# Patient Record
Sex: Female | Born: 1990 | Race: White | Hispanic: No | Marital: Single | Smoking: Never smoker
Health system: Southern US, Community
[De-identification: ages and names within clinical notes are randomized; demographics above are authoritative.]

## PROBLEM LIST (undated history)

## (undated) DIAGNOSIS — E282 Polycystic ovarian syndrome: Principal | ICD-10-CM

## (undated) DIAGNOSIS — E221 Hyperprolactinemia: Secondary | ICD-10-CM

## (undated) DIAGNOSIS — F99 Mental disorder, not otherwise specified: Secondary | ICD-10-CM

## (undated) HISTORY — DX: Polycystic ovarian syndrome: E28.2

## (undated) HISTORY — DX: Hyperprolactinemia: E22.1

## (undated) HISTORY — DX: Mental disorder, not otherwise specified: F99

---
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
## 2013-11-30 ENCOUNTER — Emergency Department: Payer: Self-pay | Admitting: Emergency Medicine

## 2015-10-24 ENCOUNTER — Emergency Department: Payer: PRIVATE HEALTH INSURANCE

## 2015-10-24 ENCOUNTER — Emergency Department
Admission: EM | Admit: 2015-10-24 | Discharge: 2015-10-24 | Disposition: A | Discharge status: Home / Self Care | Payer: PRIVATE HEALTH INSURANCE | Attending: Emergency Medicine | Admitting: Emergency Medicine

## 2015-10-24 DIAGNOSIS — M62830 Muscle spasm of back: Secondary | ICD-10-CM | POA: Diagnosis not present

## 2015-10-24 DIAGNOSIS — Z880 Allergy status to penicillin: Secondary | ICD-10-CM | POA: Insufficient documentation

## 2015-10-24 DIAGNOSIS — Z3202 Encounter for pregnancy test, result negative: Secondary | ICD-10-CM | POA: Insufficient documentation

## 2015-10-24 DIAGNOSIS — N200 Calculus of kidney: Secondary | ICD-10-CM | POA: Diagnosis not present

## 2015-10-24 DIAGNOSIS — N939 Abnormal uterine and vaginal bleeding, unspecified: Secondary | ICD-10-CM

## 2015-10-24 DIAGNOSIS — R109 Unspecified abdominal pain: Secondary | ICD-10-CM | POA: Diagnosis present

## 2015-10-24 LAB — COMPREHENSIVE METABOLIC PANEL
ALBUMIN: 4 g/dL (ref 3.5–5.0)
ALT: 18 U/L (ref 14–54)
ANION GAP: 7 (ref 5–15)
AST: 15 U/L (ref 15–41)
Alkaline Phosphatase: 48 U/L (ref 38–126)
BILIRUBIN TOTAL: 0.6 mg/dL (ref 0.3–1.2)
BUN: 17 mg/dL (ref 6–20)
CO2: 21 mmol/L — ABNORMAL LOW (ref 22–32)
Calcium: 9.2 mg/dL (ref 8.9–10.3)
Chloride: 110 mmol/L (ref 101–111)
Creatinine, Ser: 0.9 mg/dL (ref 0.44–1.00)
GFR calc Af Amer: >60 mL/min (ref >60)
Glucose, Bld: 100 mg/dL — ABNORMAL HIGH (ref 65–99)
POTASSIUM: 4 mmol/L (ref 3.5–5.1)
Sodium: 138 mmol/L (ref 135–145)
TOTAL PROTEIN: 7.1 g/dL (ref 6.5–8.1)

## 2015-10-24 LAB — CBC
HEMATOCRIT: 38.9 % (ref 35.0–47.0)
Hemoglobin: 13.1 g/dL (ref 12.0–16.0)
MCH: 28.6 pg (ref 26.0–34.0)
MCHC: 33.7 g/dL (ref 32.0–36.0)
MCV: 84.8 fL (ref 80.0–100.0)
PLATELETS: 213 10*3/uL (ref 150–440)
RBC: 4.59 MIL/uL (ref 3.80–5.20)
RDW: 12.8 % (ref 11.5–14.5)
WBC: 5.7 10*3/uL (ref 3.6–11.0)

## 2015-10-24 LAB — URINALYSIS COMPLETE WITH MICROSCOPIC (ARMC ONLY)
BACTERIA UA: NONE SEEN
BILIRUBIN URINE: NEGATIVE
Bacteria, UA: NONE SEEN
Bilirubin Urine: NEGATIVE
GLUCOSE, UA: NEGATIVE mg/dL
GLUCOSE, UA: NEGATIVE mg/dL
KETONES UR: NEGATIVE mg/dL
Ketones, ur: NEGATIVE mg/dL
NITRITE: NEGATIVE
Nitrite: NEGATIVE
PH: 5 (ref 5.0–8.0)
Protein, ur: 30 mg/dL — AB
Protein, ur: NEGATIVE mg/dL
SPECIFIC GRAVITY, URINE: 1.02 (ref 1.005–1.030)
SPECIFIC GRAVITY, URINE: 1.008 (ref 1.005–1.030)
pH: 7 (ref 5.0–8.0)

## 2015-10-24 LAB — LIPASE, BLOOD: LIPASE: 25 U/L (ref 11–51)

## 2015-10-24 LAB — POCT PREGNANCY, URINE: PREG TEST UR: NEGATIVE

## 2015-10-24 MED ORDER — KETOROLAC TROMETHAMINE 30 MG/ML IJ SOLN
30.0000 mg | Freq: Once | INTRAMUSCULAR | Status: AC
  Administered 2015-10-24: 30 mg via INTRAVENOUS
  Filled 2015-10-24: qty 1

## 2015-10-24 MED ORDER — SODIUM CHLORIDE 0.9 % IV SOLN
1000.0000 mL | Freq: Once | INTRAVENOUS | Status: AC
  Administered 2015-10-24: 1000 mL via INTRAVENOUS

## 2015-10-24 MED ORDER — ONDANSETRON HCL 4 MG/2ML IJ SOLN
4.0000 mg | Freq: Once | INTRAMUSCULAR | Status: AC
  Administered 2015-10-24: 4 mg via INTRAVENOUS
  Filled 2015-10-24: qty 2

## 2015-10-24 MED ORDER — HYDROCODONE-ACETAMINOPHEN 5-325 MG PO TABS: 1.0000 | ORAL_TABLET | ORAL | Status: DC | PRN

## 2015-10-24 MED ORDER — NAPROXEN 500 MG PO TABS: 500.0000 mg | ORAL_TABLET | Freq: Two times a day (BID) | ORAL | Status: DC

## 2015-10-24 MED ORDER — MORPHINE SULFATE (PF) 2 MG/ML IV SOLN
2.0000 mg | Freq: Once | INTRAVENOUS | Status: AC
  Administered 2015-10-24: 2 mg via INTRAVENOUS
  Filled 2015-10-24: qty 1

## 2015-10-24 MED ORDER — ONDANSETRON HCL 4 MG PO TABS: 4.0000 mg | ORAL_TABLET | Freq: Every day | ORAL | Status: DC | PRN

## 2015-10-24 NOTE — ED Notes (Signed)
Back from ct   Iv pain meds given side rails up and effects explained

## 2015-10-24 NOTE — Discharge Instructions (Signed)
Flank Pain °Flank pain refers to pain that is located on the side of the body between the upper abdomen and the back. The pain may occur over a short period of time (acute) or may be long-term or reoccurring (chronic). It may be mild or severe. Flank pain can be caused by many things. °CAUSES  °Some of the more common causes of flank pain include: °· Muscle strains.   °· Muscle spasms.   °· A disease of your spine (vertebral disk disease).   °· A lung infection (pneumonia).   °· Fluid around your lungs (pulmonary edema).   °· A kidney infection.   °· Kidney stones.   °· A very painful skin rash caused by the chickenpox virus (shingles).   °· Gallbladder disease.   °HOME CARE INSTRUCTIONS  °Home care will depend on the cause of your pain. In general, °· Rest as directed by your caregiver. °· Drink enough fluids to keep your urine clear or pale yellow. °· Only take over-the-counter or prescription medicines as directed by your caregiver. Some medicines may help relieve the pain. °· Tell your caregiver about any changes in your pain. °· Follow up with your caregiver as directed. °SEEK IMMEDIATE MEDICAL CARE IF:  °· Your pain is not controlled with medicine.   °· You have new or worsening symptoms. °· Your pain increases.   °· You have abdominal pain.   °· You have shortness of breath.   °· You have persistent nausea or vomiting.   °· You have swelling in your abdomen.   °· You feel faint or pass out.   °· You have blood in your urine. °· You have a fever or persistent symptoms for more than 2-3 days. °· You have a fever and your symptoms suddenly get worse. °MAKE SURE YOU:  °· Understand these instructions. °· Will watch your condition. °· Will get help right away if you are not doing well or get worse. °  °This information is not intended to replace advice given to you by your health care provider. Make sure you discuss any questions you have with your health care provider. °  °Document Released: 12/27/2005 Document  Revised: 07/30/2012 Document Reviewed: 06/19/2012 °Elsevier Interactive Patient Education ©2016 Elsevier Inc. ° °

## 2015-10-24 NOTE — ED Notes (Signed)
Resting at present  Family at bedside   

## 2015-10-24 NOTE — ED Notes (Signed)
Additional pain meds given. Family at bedside  

## 2015-10-24 NOTE — ED Notes (Signed)
States pain has eased off some   Dr Cyril LoosenKinner in with pt

## 2015-10-24 NOTE — ED Notes (Signed)
Sudden onset of left flank pain.  Patent with nausea but no vomiting.  Denies any abnormal urinary symptoms.

## 2015-10-24 NOTE — ED Provider Notes (Signed)
[facility] Emergency Department Provider Note  ____________________________________________  Time seen: On arrival  I have reviewed the triage vital signs and the nursing notes.   HISTORY  Chief Complaint Flank Pain    HPI Maureen Mcbride is a 24 y.o. female who presents with complaints of moderate to severe left flank pain that is sharp in nature. Patient reports her left side was aching yesterday and during her shift at work last night but she woke up at 6 AM and had severe left flank pain with nausea that radiated to her groin. No fevers no chills. No dysuria. Does have history of UTIs but this does not feel similar. She has a history of an appendectomy, chronic back pain, no other medical problems. No fevers no chills.     History reviewed. No pertinent past medical history.  There are no active problems to display for this patient.   History reviewed. No pertinent past surgical history.  No current outpatient prescriptions on file.  Allergies Penicillins  History reviewed. No pertinent family history.  Social History Social History  Substance Use Topics  . Smoking status: Never Smoker   . Smokeless tobacco: None  . Alcohol Use: No    Review of Systems  Constitutional: Negative for fever. Eyes: Negative for visual changes. ENT: Negative for sore throat Cardiovascular: Negative for chest pain. Respiratory: Negative for shortness of breath. Gastrointestinal: Positive for nausea Genitourinary: Negative for dysuria. Musculoskeletal: Positive for back pain Skin: Negative for rash. Neurological: Negative for headaches or focal weakness Psychiatric: No anxiety    ____________________________________________   PHYSICAL EXAM:  VITAL SIGNS: ED Triage Vitals  Enc Vitals Group     BP 10/24/15 0711 128/85 mmHg     Pulse Rate 10/24/15 0711 74     Resp 10/24/15 0711 18     Temp 10/24/15 0711 97.7 F (36.5 C)     Temp Source 10/24/15  0711 Oral     SpO2 10/24/15 0711 100 %     Weight 10/24/15 0711 160 lb (72.576 kg)     Height 10/24/15 0711 4\' 10"  (1.473 m)     Head Cir --      Peak Flow --      Pain Score 10/24/15 0712 8     Pain Loc --      Pain Edu? --      Excl. in GC? --      Constitutional: Alert and oriented. Well appearing but uncomfortable. Pleasant and interactive Eyes: Conjunctivae are normal.  ENT   Head: Normocephalic and atraumatic.   Mouth/Throat: Mucous membranes are moist. Cardiovascular: Normal rate, regular rhythm. Normal and symmetric distal pulses are present in all extremities. No murmurs, rubs, or gallops. Respiratory: Normal respiratory effort without tachypnea nor retractions.  Gastrointestinal: Soft and non-tender in all quadrants. No distention. There is no CVA tenderness. Genitourinary: deferred Musculoskeletal: Nontender with normal range of motion in all extremities. No lower extremity tenderness nor edema. Patient does have left lumbar paraspinal muscle spasm with mild discomfort but this does not feel similar to the pain she is having. Neurologic:  Normal speech and language. No gross focal neurologic deficits are appreciated. Skin:  Skin is warm, dry and intact. No rash noted. Psychiatric: Mood and affect are normal. Patient exhibits appropriate insight and judgment.  ____________________________________________    LABS (pertinent positives/negatives)  Labs Reviewed  URINALYSIS COMPLETEWITH MICROSCOPIC ([facility] ONLY)  CBC  COMPREHENSIVE METABOLIC PANEL  LIPASE, BLOOD  POC URINE PREG, ED  POCT  PREGNANCY, URINE    ____________________________________________   EKG  None  ____________________________________________    RADIOLOGY I have personally reviewed any xrays that were ordered on this patient: CT renal stone study shows no stone. Ultrasound pelvis shows none abnormality  ____________________________________________   PROCEDURES  Procedure(s)  performed: none  Critical Care performed: none  ____________________________________________   INITIAL IMPRESSION / ASSESSMENT AND PLAN / ED COURSE  Pertinent labs & imaging results that were available during my care of the patient were reviewed by me and considered in my medical decision making (see chart for details).  Patient's presentation is suspicious for kidney stone given abrupt onset left flank pain rated to groin. We will check urine, labs, CT renal stone study. We will give Toradol IV, fluids and Zofran IV.  Ultrasound negative. Given that CT was negative patient is to have blood in her urine I'm suspicious of a passed kidney stone. I discussed return precautions with the patient extensively. No evidence of urinary tract infection.  ____________________________________________   FINAL CLINICAL IMPRESSION(S) / ED DIAGNOSES  Final diagnoses:  Flank pain, acute     Jene Every, MD 10/24/15 1454

## 2015-12-13 ENCOUNTER — Encounter: Payer: Self-pay | Admitting: *Deleted

## 2015-12-13 ENCOUNTER — Emergency Department
Admission: EM | Admit: 2015-12-13 | Discharge: 2015-12-13 | Disposition: A | Discharge status: Home / Self Care | Payer: Managed Care, Other (non HMO) | Attending: Emergency Medicine | Admitting: Emergency Medicine

## 2015-12-13 DIAGNOSIS — R0981 Nasal congestion: Secondary | ICD-10-CM | POA: Diagnosis present

## 2015-12-13 DIAGNOSIS — Z880 Allergy status to penicillin: Secondary | ICD-10-CM | POA: Diagnosis not present

## 2015-12-13 DIAGNOSIS — J019 Acute sinusitis, unspecified: Secondary | ICD-10-CM | POA: Insufficient documentation

## 2015-12-13 DIAGNOSIS — M791 Myalgia: Secondary | ICD-10-CM | POA: Diagnosis not present

## 2015-12-13 DIAGNOSIS — R059 Cough, unspecified: Secondary | ICD-10-CM

## 2015-12-13 DIAGNOSIS — R05 Cough: Secondary | ICD-10-CM | POA: Diagnosis not present

## 2015-12-13 MED ORDER — PROMETHAZINE-DM 6.25-15 MG/5ML PO SYRP: 5.0000 mL | ORAL_SOLUTION | Freq: Four times a day (QID) | ORAL | Status: DC | PRN

## 2015-12-13 MED ORDER — AZITHROMYCIN 250 MG PO TABS: ORAL_TABLET | ORAL | Status: DC

## 2015-12-13 MED ORDER — FLUTICASONE PROPIONATE 50 MCG/ACT NA SUSP: 2.0000 | Freq: Every day | NASAL | Status: DC

## 2015-12-13 NOTE — Discharge Instructions (Signed)
Sinusitis, Adult °Sinusitis is redness, soreness, and inflammation of the paranasal sinuses. Paranasal sinuses are air pockets within the bones of your face. They are located beneath your eyes, in the middle of your forehead, and above your eyes. In healthy paranasal sinuses, mucus is able to drain out, and air is able to circulate through them by way of your nose. However, when your paranasal sinuses are inflamed, mucus and air can become trapped. This can allow bacteria and other germs to grow and cause infection. °Sinusitis can develop quickly and last only a short time (acute) or continue over a long period (chronic). Sinusitis that lasts for more than 12 weeks is considered chronic. °CAUSES °Causes of sinusitis include: °· Allergies. °· Structural abnormalities, such as displacement of the cartilage that separates your nostrils (deviated septum), which can decrease the air flow through your nose and sinuses and affect sinus drainage. °· Functional abnormalities, such as when the small hairs (cilia) that line your sinuses and help remove mucus do not work properly or are not present. °SIGNS AND SYMPTOMS °Symptoms of acute and chronic sinusitis are the same. The primary symptoms are pain and pressure around the affected sinuses. Other symptoms include: °· Upper toothache. °· Earache. °· Headache. °· Bad breath. °· Decreased sense of smell and taste. °· A cough, which worsens when you are lying flat. °· Fatigue. °· Fever. °· Thick drainage from your nose, which often is green and may contain pus (purulent). °· Swelling and warmth over the affected sinuses. °DIAGNOSIS °Your health care provider will perform a physical exam. During your exam, your health care provider may perform any of the following to help determine if you have acute sinusitis or chronic sinusitis: °· Look in your nose for signs of abnormal growths in your nostrils (nasal polyps). °· Tap over the affected sinus to check for signs of  infection. °· View the inside of your sinuses using an imaging device that has a light attached (endoscope). °If your health care provider suspects that you have chronic sinusitis, one or more of the following tests may be recommended: °· Allergy tests. °· Nasal culture. A sample of mucus is taken from your nose, sent to a lab, and screened for bacteria. °· Nasal cytology. A sample of mucus is taken from your nose and examined by your health care provider to determine if your sinusitis is related to an allergy. °TREATMENT °Most cases of acute sinusitis are related to a viral infection and will resolve on their own within 10 days. Sometimes, medicines are prescribed to help relieve symptoms of both acute and chronic sinusitis. These may include pain medicines, decongestants, nasal steroid sprays, or saline sprays. °However, for sinusitis related to a bacterial infection, your health care provider will prescribe antibiotic medicines. These are medicines that will help kill the bacteria causing the infection. °Rarely, sinusitis is caused by a fungal infection. In these cases, your health care provider will prescribe antifungal medicine. °For some cases of chronic sinusitis, surgery is needed. Generally, these are cases in which sinusitis recurs more than 3 times per year, despite other treatments. °HOME CARE INSTRUCTIONS °· Drink plenty of water. Water helps thin the mucus so your sinuses can drain more easily. °· Use a humidifier. °· Inhale steam 3-4 times a day (for example, sit in the bathroom with the shower running). °· Apply a warm, moist washcloth to your face 3-4 times a day, or as directed by your health care provider. °· Use saline nasal sprays to help   moisten and clean your sinuses.  Take medicines only as directed by your health care provider.  If you were prescribed either an antibiotic or antifungal medicine, finish it all even if you start to feel better. SEEK IMMEDIATE MEDICAL CARE IF:  You have  increasing pain or severe headaches.  You have nausea, vomiting, or drowsiness.  You have swelling around your face.  You have vision problems.  You have a stiff neck.  You have difficulty breathing.   This information is not intended to replace advice given to you by your health care provider. Make sure you discuss any questions you have with your health care provider.   Document Released: 11/05/2005 Document Revised: 11/26/2014 Document Reviewed: 11/20/2011 Elsevier Interactive Patient Education 2016 Elsevier Inc.  Cough, Adult A cough helps to clear your throat and lungs. A cough may last only 2-3 weeks (acute), or it may last longer than 8 weeks (chronic). Many different things can cause a cough. A cough may be a sign of an illness or another medical condition. HOME CARE  Pay attention to any changes in your cough.  Take medicines only as told by your doctor.  If you were prescribed an antibiotic medicine, take it as told by your doctor. Do not stop taking it even if you start to feel better.  Talk with your doctor before you try using a cough medicine.  Drink enough fluid to keep your pee (urine) clear or pale yellow.  If the air is dry, use a cold steam vaporizer or humidifier in your home.  Stay away from things that make you cough at work or at home.  If your cough is worse at night, try using extra pillows to raise your head up higher while you sleep.  Do not smoke, and try not to be around smoke. If you need help quitting, ask your doctor.  Do not have caffeine.  Do not drink alcohol.  Rest as needed. GET HELP IF:  You have new problems (symptoms).  You cough up yellow fluid (pus).  Your cough does not get better after 2-3 weeks, or your cough gets worse.  Medicine does not help your cough and you are not sleeping well.  You have pain that gets worse or pain that is not helped with medicine.  You have a fever.  You are losing weight and you do not  know why.  You have night sweats. GET HELP RIGHT AWAY IF:  You cough up blood.  You have trouble breathing.  Your heartbeat is very fast.   This information is not intended to replace advice given to you by your health care provider. Make sure you discuss any questions you have with your health care provider.   Document Released: 07/19/2011 Document Revised: 07/27/2015 Document Reviewed: 01/12/2015 Elsevier Interactive Patient Education [employer]

## 2015-12-13 NOTE — ED Provider Notes (Signed)
[facility] Emergency Department Provider Note  ____________________________________________  Time seen: Approximately 1:15 PM  I have reviewed the triage vital signs and the nursing notes.   HISTORY  Chief Complaint URI    HPI Maureen Mcbride is a 25 y.o. female , NAD. Patient presents emergency Department with 2 weeks of cough and nasal congestion worsening over the last 3 days. Has had fever, chills, body aches over the last 3 days. Has increased sinus pressure and pain. Tylenol alleviates fever and aches but only for a short while. Has also taken DayQuil over-the-counter without significant alleviation of symptoms. Denies chest pain, back pain, wheezing, shortness of breath.    History reviewed. No pertinent past medical history.  There are no active problems to display for this patient.   History reviewed. No pertinent past surgical history.  Current Outpatient Rx  Name  Route  Sig  Dispense  Refill  . azithromycin (ZITHROMAX Z-PAK) 250 MG tablet      Take 2 tablets (500 mg) on  Day 1,  followed by 1 tablet (250 mg) once daily on Days 2 through 5.   6 each   0   . fluticasone (FLONASE) 50 MCG/ACT nasal spray   Each Nare   Place 2 sprays into both nostrils daily.   16 g   0   . promethazine-dextromethorphan (PROMETHAZINE-DM) 6.25-15 MG/5ML syrup   Oral   Take 5 mLs by mouth 4 (four) times daily as needed for cough.   118 mL   0     Allergies Penicillins - causes swelling  No family history on file.  Social History Social History  Substance Use Topics  . Smoking status: Never Smoker   . Smokeless tobacco: None  . Alcohol Use: No     Review of Systems  Constitutional: Positive for fever, chills, fatigue. Eyes: No visual changes. No discharge ENT: No sore throat. Purulent nasal discharge. Sinus pressure. Cardiovascular: No chest pain. Respiratory: Productive cough. No shortness of breath, wheezing Gastrointestinal: No  abdominal pain.  No nausea, no vomiting.   Musculoskeletal: Generalized myalgias Skin: Negative for rash. Neurological: Positive for sinus headaches 10-point ROS otherwise negative.  ____________________________________________   PHYSICAL EXAM:  VITAL SIGNS: ED Triage Vitals  Enc Vitals Group     BP 12/13/15 1308 139/89 mmHg     Pulse Rate 12/13/15 1308 112     Resp 12/13/15 1308 20     Temp 12/13/15 1308 98.9 F (37.2 C)     Temp Source 12/13/15 1308 Oral     SpO2 12/13/15 1308 100 %     Weight 12/13/15 1308 160 lb (72.576 kg)     Height 12/13/15 1308  (1.473 m)     Head Cir --      Peak Flow --      Pain Score --      Pain Loc --      Pain Edu? --      Excl. in GC? --     Constitutional: Alert and oriented. Well appearing and in no acute distress. Eyes: Conjunctivae are normal. EOMI without pain Head: Atraumatic. ENT:      Ears: Bilateral TMs are visualized without perforation. Trace serous fluid bilaterally. Non-erythematous. Bilateral canals normal.      Nose: Purulent green rhinorrhea.      Mouth/Throat: Mucous membranes are moist. No erythema or exudate of the oropharynx. No oral lesions Neck: No stridor.  Hematological/Lymphatic/Immunilogical: No cervical lymphadenopathy. Cardiovascular: Normal rate, regular  rhythm. Normal S1 and S2.   Respiratory: Normal respiratory effort without tachypnea or retractions. Lungs CTAB. Neurologic:  Normal speech and language. No gross focal neurologic deficits are appreciated.  Skin:  Skin is warm, dry and intact. No rash noted. Psychiatric: Mood and affect are normal. Speech and behavior are normal. Patient exhibits appropriate insight and judgement.   ____________________________________________   LABS (all labs ordered are listed, but only abnormal results are displayed)  Labs Reviewed - No data to  display ____________________________________________  EKG  None ____________________________________________  RADIOLOGY  None ____________________________________________    PROCEDURES  Procedure(s) performed: None    Medications - No data to display   ____________________________________________   INITIAL IMPRESSION / ASSESSMENT AND PLAN / ED COURSE  Pertinent labs & imaging results that were available during my care of the patient were reviewed by me and considered in my medical decision making (see chart for details).  Patient's diagnosis is consistent with bacterial sinusitis with cough. Patient will be discharged home with prescriptions for azithromycin to treat bacterial infection and Flonase to decrease congestion and sinus pressure. Patient is to follow up with primary care if symptoms persist past this treatment course. Patient is given ED precautions to return to the ED for any worsening or new symptoms.     ____________________________________________  FINAL CLINICAL IMPRESSION(S) / ED DIAGNOSES  Final diagnoses:  Acute sinusitis, recurrence not specified, unspecified location  Cough      NEW MEDICATIONS STARTED DURING THIS VISIT:  New Prescriptions   AZITHROMYCIN (ZITHROMAX Z-PAK) 250 MG TABLET    Take 2 tablets (500 mg) on  Day 1,  followed by 1 tablet (250 mg) once daily on Days 2 through 5.   FLUTICASONE (FLONASE) 50 MCG/ACT NASAL SPRAY    Place 2 sprays into both nostrils daily.   PROMETHAZINE-DEXTROMETHORPHAN (PROMETHAZINE-DM) 6.25-15 MG/5ML SYRUP    Take 5 mLs by mouth 4 (four) times daily as needed for cough.        Hope Pigeon, PA-C 12/13/15 1337  Governor Rooks, MD 12/13/15 1525

## 2015-12-13 NOTE — ED Notes (Signed)
Pt complains of fever, chills and body aches for 3 days

## 2016-11-19 DIAGNOSIS — E282 Polycystic ovarian syndrome: Secondary | ICD-10-CM

## 2016-11-19 HISTORY — DX: Polycystic ovarian syndrome: E28.2

## 2016-11-27 LAB — HM PAP SMEAR
HM PAP: NEGATIVE
HM PAP: NORMAL

## 2017-01-13 ENCOUNTER — Emergency Department
Admission: EM | Admit: 2017-01-13 | Discharge: 2017-01-13 | Disposition: A | Discharge status: Home / Self Care | Payer: Managed Care, Other (non HMO) | Attending: Emergency Medicine | Admitting: Emergency Medicine

## 2017-01-13 DIAGNOSIS — J309 Allergic rhinitis, unspecified: Secondary | ICD-10-CM | POA: Insufficient documentation

## 2017-01-13 DIAGNOSIS — J040 Acute laryngitis: Secondary | ICD-10-CM | POA: Diagnosis not present

## 2017-01-13 DIAGNOSIS — Z79899 Other long term (current) drug therapy: Secondary | ICD-10-CM | POA: Diagnosis not present

## 2017-01-13 DIAGNOSIS — J3089 Other allergic rhinitis: Secondary | ICD-10-CM

## 2017-01-13 DIAGNOSIS — R0981 Nasal congestion: Secondary | ICD-10-CM | POA: Diagnosis present

## 2017-01-13 MED ORDER — DEXAMETHASONE SODIUM PHOSPHATE 10 MG/ML IJ SOLN
10.0000 mg | Freq: Once | INTRAMUSCULAR | Status: AC
  Administered 2017-01-13: 10 mg via INTRAMUSCULAR

## 2017-01-13 MED ORDER — DEXAMETHASONE SODIUM PHOSPHATE 10 MG/ML IJ SOLN
INTRAMUSCULAR | Status: AC
  Administered 2017-01-13: 10 mg via INTRAMUSCULAR
  Filled 2017-01-13: qty 1

## 2017-01-13 MED ORDER — BENZONATATE 100 MG PO CAPS: 100.0000 mg | ORAL_CAPSULE | Freq: Three times a day (TID) | ORAL | 0 refills | Status: DC | PRN

## 2017-01-13 MED ORDER — FLUTICASONE PROPIONATE 50 MCG/ACT NA SUSP: 2.0000 | Freq: Every day | NASAL | 0 refills | Status: DC

## 2017-01-13 MED ORDER — PSEUDOEPH-BROMPHEN-DM 30-2-10 MG/5ML PO SYRP: 5.0000 mL | ORAL_SOLUTION | Freq: Four times a day (QID) | ORAL | 0 refills | Status: DC | PRN

## 2017-01-13 NOTE — ED Provider Notes (Signed)
[facility] Emergency Department Provider Note ____________________________________________  Time seen: 0718  I have reviewed the triage vital signs and the nursing notes.  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 The patient denies any fevers, chills, sweats. She denies any sore throat, wheezing, or productive cough. She gives a history of vasomotor rhinitis, and reports sinus infections a few times a year. She has taken her daily allergy medicine and has use her nasal steroid intermittently.  History reviewed. No pertinent past medical history.  There are no active problems to display for this patient.  History reviewed. No pertinent surgical history.  Prior to Admission medications   Medication Sig Start Date End Date Taking? Authorizing Provider  azithromycin (ZITHROMAX Z-PAK) 250 MG tablet Take 2 tablets (500 mg) on  Day 1,  followed by 1 tablet (250 mg) once daily on Days 2 through 5. 12/13/15   Jami L Hagler, PA-C  benzonatate (TESSALON PERLES) 100 MG capsule Take 1 capsule (100 mg total) by mouth 3 (three) times daily as needed for cough (Take 1-2 per dose). 01/13/17   Dominga Mcduffie V Bacon Khyson Sebesta, PA-C  brompheniramine-pseudoephedrine-DM 30-2-10 MG/5ML syrup Take 5 mLs by mouth 4 (four) times daily as needed. 01/13/17   Yarissa Reining V Bacon Nialah Saravia, PA-C  fluticasone (FLONASE) 50 MCG/ACT nasal spray Place 2 sprays into both nostrils daily. 12/13/15 12/12/16  Jami L Hagler, PA-C  fluticasone (FLONASE) 50 MCG/ACT nasal spray Place 2 sprays into both nostrils daily. 01/13/17   Ronee Ranganathan V Bacon Kyndel Egger, PA-C  promethazine-dextromethorphan (PROMETHAZINE-DM) 6.25-15 MG/5ML syrup Take 5 mLs by mouth 4 (four) times daily as needed for cough. 12/13/15   Jami L Hagler, PA-C    Allergies Penicillins  No family history on  file.  Social History Social History  Substance Use Topics  . Smoking status: Never Smoker  . Smokeless tobacco: Never Used  . Alcohol use No    Review of Systems  Constitutional: Negative for fever. Eyes: Negative for visual changes. ENT: Negative for sore throat. Reports sinus congestion, postnasal drainage, and hoarseness. Cardiovascular: Negative for chest pain. Respiratory: Negative for shortness of breath. Skin: Negative for rash. Neurological: Negative for headaches, focal weakness or numbness. ____________________________________________  PHYSICAL EXAM:  VITAL SIGNS: ED Triage Vitals  Enc Vitals Group     BP 01/13/17 0713 115/66     Pulse Rate 01/13/17 0713 71     Resp 01/13/17 0713 16     Temp 01/13/17 0713 98.8 F (37.1 C)     Temp Source 01/13/17 0713 Oral     SpO2 01/13/17 0713 99 %     Weight 01/13/17 0711 160 lb (72.6 kg)     Height 01/13/17 0711 4\' 10"  (1.473 m)     Head Circumference --      Peak Flow --      Pain Score --      Pain Loc --      Pain Edu? --      Excl. in GC? --     Constitutional: Alert and oriented. Well appearing and in no distress. Head: Normocephalic and atraumatic. Eyes: Conjunctivae are normal. PERRL. Normal extraocular movements Ears: Canals clear. TMs intact bilaterally. Nose: Mild nasal congestion. Nasal turbinates are pink, moist, but enlarged. Clear rhinorrhea. No epistaxis. Mouth/Throat: Mucous membranes are moist. Uvula is midline and tonsils  are flat. No oropharyngeal erythema. Neck: Supple. No thyromegaly. Hematological/Lymphatic/Immunological: No cervical lymphadenopathy. Cardiovascular: Normal rate, regular rhythm. Normal distal pulses. Respiratory: Normal respiratory effort. No wheezes/rales/rhonchi. Psychiatric: Mood and affect are normal. Patient exhibits appropriate insight and judgment. ____________________________________________  PROCEDURES  Decadron 10 mg  IM ____________________________________________  INITIAL IMPRESSION / ASSESSMENT AND PLAN / ED COURSE  Patient with acute vasomotor rhinitis, postnasal drainage, and pharyngitis. Her exam is otherwise benign without any signs of an acute infectious process. She is discharged at this time with prescriptions for [facility] Per[facility], Flonase, and Bromfed-DM syrup. She is also given a IM dose of Decadron in the ED to help with her laryngitis. She'll continue with her daily meds, consider use of a room humidifier, and hydrated necessary. She will follow with primary care provider or return to the ED as needed. She is cleared to return to work at this time. ____________________________________________  FINAL CLINICAL IMPRESSION(S) / ED DIAGNOSES  Final diagnoses:  Acute non-seasonal allergic rhinitis, unspecified trigger  Laryngitis      Lissa HoardJenise V Bacon Angelamarie Avakian, PA-C 01/13/17 0748    Myrna Blazeravid Matthew Schaevitz, MD 01/13/17 [id number]

## 2017-01-13 NOTE — ED Triage Notes (Signed)
Pt c/o sinus congestion for the past 3 days.

## 2017-01-13 NOTE — Discharge Instructions (Signed)
Your exam is essentially normal today. You have signs of nasal congestion, post-nasal drainage, and hoarseness. Take the prescription meds as directed. Continue your daily allergy medicine and nasal spray. Consider using a room humidifier at night. Follow-up with your provider for continued symptoms.

## 2017-03-13 ENCOUNTER — Ambulatory Visit: Payer: Self-pay | Admitting: Obstetrics and Gynecology

## 2017-05-17 ENCOUNTER — Encounter: Payer: Self-pay | Admitting: Obstetrics and Gynecology

## 2017-05-17 ENCOUNTER — Ambulatory Visit (INDEPENDENT_AMBULATORY_CARE_PROVIDER_SITE_OTHER): Payer: Managed Care, Other (non HMO) | Admitting: Obstetrics and Gynecology

## 2017-05-17 VITALS — BP 120/74 | Ht <= 58 in | Wt 160.0 lb

## 2017-05-17 DIAGNOSIS — E221 Hyperprolactinemia: Secondary | ICD-10-CM

## 2017-05-17 DIAGNOSIS — E282 Polycystic ovarian syndrome: Secondary | ICD-10-CM

## 2017-05-17 MED ORDER — ETONOGESTREL-ETHINYL ESTRADIOL 0.12-0.015 MG/24HR VA RING: VAGINAL_RING | VAGINAL | 0 refills | Status: DC

## 2017-05-17 NOTE — Progress Notes (Signed)
Obstetrics & Gynecology Office Visit   Chief Complaint  Patient presents with  . Follow-up  PCOS  History of Present Illness: 26 y.o. G0P0000 female who Presents in follow-up from her visit in January for treatment of polycystic ovary syndrome. She began taking oral contraceptives in January and took them for about 3 months. She states that initially her periods were well controlled on the medication. She notes for the last couple of months that her periods have been more sporadic. She states her last menses was at the end of April.  She does state that she has not taken the oral contraceptive since about 2 months ago as she ran out of her prescription and did not attempt to renew her prescription.   Past Medical History:  Diagnosis Date  . Hyperprolactinemia (HCC)   . PCOS (polycystic ovarian syndrome) 11/2016    Past Surgical History:  Procedure Laterality Date  . APPENDECTOMY    . URETHRAL DILATION      Gynecologic History: Patient's last menstrual period was 03/17/2017.  Obstetric History: G0P0000  Family History  Problem Relation Age of Onset  . Colon cancer Maternal Grandfather   . Diabetes Mellitus II Paternal Grandmother     Social History   Social History  . Marital status: Single    Spouse name: N/A  . Number of children: N/A  . Years of education: N/A   Occupational History  . Not on file.   Social History Main Topics  . Smoking status: Never Smoker  . Smokeless tobacco: Never Used  . Alcohol use No  . Drug use: No  . Sexual activity: Yes    Birth control/ protection: Pill   Other Topics Concern  . Not on file   Social History Narrative  . No narrative on file    Allergies  Allergen Reactions  . Penicillins Hives      Medication Sig Start Date End Date Taking? Authorizing Provider  fluticasone (FLONASE) 50 MCG/ACT nasal spray Place 2 sprays into both nostrils daily. Patient not taking: Reported on 05/17/2017 01/13/17   Menshew, Charlesetta Ivory, PA-C    Review of Systems  Constitutional: Negative.   HENT: Negative.   Eyes: Negative.        No peripheral visual field loss  Respiratory: Negative.   Cardiovascular: Negative.   Gastrointestinal: Negative.   Genitourinary: Negative.   Musculoskeletal: Negative.   Skin: Negative.   Neurological: Negative.   Psychiatric/Behavioral: Negative.      Physical Exam BP 120/74   Ht 4\' 10"  (1.473 m)   Wt 160 lb (72.6 kg)   LMP 03/17/2017   BMI 33.44 kg/m  Patient's last menstrual period was 03/17/2017. Physical Exam  Constitutional: She is oriented to person, place, and time. She appears well-developed and well-nourished. No distress.  Eyes: EOM are normal. No scleral icterus.  Musculoskeletal: Normal range of motion. She exhibits no edema.  Neurological: She is alert and oriented to person, place, and time. No cranial nerve deficit.  Skin: Skin is warm and dry. No erythema.  Psychiatric: She has a normal mood and affect. Her behavior is normal. Judgment normal.   Assessment: 26 y.o. G0P0000 female here for follow up of PCOS and hyperprolactinemia.    Plan: Problem List Items Addressed This Visit    PCOS (polycystic ovarian syndrome) - Primary (Chronic)   Relevant Orders   Prolactin   Hyperprolactinemia (HCC)   Relevant Orders   Prolactin    Since she states that she is  not good at taking pills, but it appears she had a good response perhaps while she was correctly taking the pills, recommended the NuvaRing for her, which she agrees is a good idea. I provided samples for three months. Discussed correct use of the NuvaRing.  Will have her follow up in three months, if her symptoms have not improved. If she continues to have heavy periods, will check for vWB disease.  If she is doing well, then she can follow up in 1 year. For her hyperprolactinemia (she had a prolactin of 36 in January), will recheck. If still elevated, may consider consult to endocrinology.  20  minutes spent in face to face discussion with > 50% spent in counseling and management of her PCOS and hyperprolactinemia.   Thomasene MohairStephen Rahshawn Remo, MD 05/17/2017 1:03 PM

## 2017-05-18 LAB — PROLACTIN: PROLACTIN: 46.6 ng/mL — AB (ref 4.8–23.3)

## 2017-05-23 NOTE — Addendum Note (Signed)
Addended by: Thomasene MohairJACKSON, STEPHEN D on: 05/23/2017 01:02 PM   Modules accepted: Orders

## 2017-06-24 ENCOUNTER — Emergency Department
Admission: EM | Admit: 2017-06-24 | Discharge: 2017-06-24 | Disposition: A | Discharge status: Home / Self Care | Payer: Managed Care, Other (non HMO) | Attending: Emergency Medicine | Admitting: Emergency Medicine

## 2017-06-24 ENCOUNTER — Encounter: Payer: Self-pay | Admitting: Emergency Medicine

## 2017-06-24 DIAGNOSIS — N922 Excessive menstruation at puberty: Secondary | ICD-10-CM

## 2017-06-24 DIAGNOSIS — N938 Other specified abnormal uterine and vaginal bleeding: Secondary | ICD-10-CM | POA: Insufficient documentation

## 2017-06-24 DIAGNOSIS — R030 Elevated blood-pressure reading, without diagnosis of hypertension: Secondary | ICD-10-CM | POA: Diagnosis not present

## 2017-06-24 DIAGNOSIS — R42 Dizziness and giddiness: Secondary | ICD-10-CM | POA: Diagnosis present

## 2017-06-24 LAB — CBC
HCT: 38.5 % (ref 35.0–47.0)
Hemoglobin: 13.4 g/dL (ref 12.0–16.0)
MCH: 29.7 pg (ref 26.0–34.0)
MCHC: 34.9 g/dL (ref 32.0–36.0)
MCV: 85.2 fL (ref 80.0–100.0)
PLATELETS: 250 10*3/uL (ref 150–440)
RBC: 4.52 MIL/uL (ref 3.80–5.20)
RDW: 13.2 % (ref 11.5–14.5)
WBC: 6.1 10*3/uL (ref 3.6–11.0)

## 2017-06-24 LAB — COMPREHENSIVE METABOLIC PANEL
ALT: 18 U/L (ref 14–54)
ANION GAP: 7 (ref 5–15)
AST: 19 U/L (ref 15–41)
Albumin: 4.1 g/dL (ref 3.5–5.0)
Alkaline Phosphatase: 52 U/L (ref 38–126)
BILIRUBIN TOTAL: 0.5 mg/dL (ref 0.3–1.2)
BUN: 12 mg/dL (ref 6–20)
CALCIUM: 9.2 mg/dL (ref 8.9–10.3)
CO2: 24 mmol/L (ref 22–32)
CREATININE: 1.06 mg/dL — AB (ref 0.44–1.00)
Chloride: 109 mmol/L (ref 101–111)
GLUCOSE: 119 mg/dL — AB (ref 65–99)
POTASSIUM: 3.7 mmol/L (ref 3.5–5.1)
Sodium: 140 mmol/L (ref 135–145)
Total Protein: 7 g/dL (ref 6.5–8.1)

## 2017-06-24 LAB — POCT PREGNANCY, URINE: Preg Test, Ur: NEGATIVE

## 2017-06-24 LAB — LIPASE, BLOOD: Lipase: 39 U/L (ref 11–51)

## 2017-06-24 NOTE — ED Triage Notes (Signed)
Pt states dizziness with standing multiple times today, progressing to dizzy while sitting. Pt states hypertensive today, CBG 99, reports heavy period x5 days-first period since beginning new birthcontrol-chaning pad and tampon both every hour. Hx PCOS. Reports staying hydrated.

## 2017-06-24 NOTE — Discharge Instructions (Signed)
Please stop the NuvaRing. Dr. Tiburcio PeaHarris would like you to schedule a follow-up appointment with obstetrics and gynecology this week. The NuvaRing may be related to your heavy period, Lightheadedness, and elevated blood pressure. Recommend close follow-up with [facility].  You have been seen in the Emergency Department (ED) today for chest pain.  As we have discussed today?s test results are normal, but you may require further testing.  Do not drive an emergency vehicle until you are certain your symptoms of light-headedness have improved. I recommend no driving for a minimum 24 hours.  Return to the Emergency Department (ED) if you experience any worsening symptoms, have increased bleeding or weakness, develop chest pain/pressure/tightness, difficulty breathing, weakness, numbness, a severe headache, vision changes or sudden sweating, or other symptoms that concern you.

## 2017-06-24 NOTE — ED Provider Notes (Signed)
[facility] Emergency Department Provider Note   ____________________________________________   First MD Initiated Contact with Patient 06/24/17 2124     (approximate)  I have reviewed the triage vital signs and the nursing notes.   HISTORY  Chief Complaint Dizziness    HPI Maureen Mcbride is a 26 y.o. female presents for evaluation of 2 episodes of feeling lightheaded in the setting of heavy menses for the last 5 days.   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 Describes a feeling of lightheadedness. Not dizzy like spinning sensation. No numbness or trouble speaking. No weakness in the arms or legs. No trouble breathing. No chest pain. Denies abdominal pain exception to having lower abdominal "cramps" which she reports are consistent with her menstrual cycle. She has a history of irregular periods, and started NuvaRing about a month ago and removed it when her period began 4-5 days ago.  She's been experiencing rather heavy vaginal bleeding about one pads every 2-3 hours.    Past Medical History:  Diagnosis Date  . Hyperprolactinemia (HCC)   . PCOS (polycystic ovarian syndrome) 11/2016    Patient Active Problem List   Diagnosis Date Noted  . PCOS (polycystic ovarian syndrome) 05/17/2017  . Hyperprolactinemia (HCC) 05/17/2017    Past Surgical History:  Procedure Laterality Date  . APPENDECTOMY    . URETHRAL DILATION      Prior to Admission medications   Medication Sig Start Date End Date Taking? Authorizing Provider  etonogestrel-ethinyl estradiol (NUVARING) 0.12-0.015 MG/24HR vaginal ring Insert vaginally and leave in place for 3 consecutive weeks, then remove for 1 week. 05/17/17   Conard NovakJackson, Stephen D, MD  fluticasone (FLONASE) 50 MCG/ACT nasal spray Place 2 sprays into both nostrils daily. 12/13/15 12/12/16  Hagler, Jami L, PA-C      Allergies Penicillins  Family History  Problem Relation Age of Onset  . Colon cancer Maternal Grandfather   . Diabetes Mellitus II Paternal Grandmother     Social History Social History  Substance Use Topics  . Smoking status: Never Smoker  . Smokeless tobacco: Never Used  . Alcohol use No    Review of Systems Constitutional: No fever/chills Eyes: No visual changes. ENT: No sore throat. Cardiovascular: Denies chest pain. Respiratory: Denies shortness of breath. Gastrointestinal: No abdominal pain except for intermittent cramping over the lower abdomen midline.  No nausea, no vomiting.  No diarrhea.  No constipation. Genitourinary: Negative for dysuria. Musculoskeletal: Negative for back pain. Skin: Negative for rash. Neurological: Negative for headaches, focal weakness or numbness.    ____________________________________________   PHYSICAL EXAM:  VITAL SIGNS: ED Triage Vitals  Enc Vitals Group     BP 06/24/17 2123 (!) 150/91     Pulse Rate 06/24/17 2123 70     Resp 06/24/17 2123 16     Temp 06/24/17 2123 97.6 F (36.4 C)     Temp Source 06/24/17 2123 Oral     SpO2 06/24/17 2123 96 %     Weight 06/24/17 2128 160 lb (72.6 kg)     Height 06/24/17 2128 4\' 10"  (1.473 m)     Head Circumference --      Peak Flow --      Pain Score 06/24/17 2123 4     Pain Loc --      Pain Edu? --      Excl. in GC? --  Constitutional: Alert and oriented. Well appearing and in no acute distress. Eyes: Conjunctivae are normal. Head: Atraumatic. Nose: No congestion/rhinnorhea. Mouth/Throat: Mucous membranes are moist. Neck: No stridor.   Cardiovascular: Normal rate, regular rhythm. Grossly normal heart sounds.  Good peripheral circulation. Respiratory: Normal respiratory effort.  No retractions. Lungs CTAB. Gastrointestinal: Soft and nontenderExcept for minimal tenderness superpubic clue. No pain in McBurney's point. Negative Murphy. No rebound or guarding in any  quadrant. No distention. Musculoskeletal: No lower extremity tenderness nor edema. No venous cords or congestion. Neurologic:  Normal speech and language. No gross focal neurologic deficits are appreciated.  Skin:  Skin is warm, dry and intact. No rash noted. Psychiatric: Mood and affect are normal. Speech and behavior are normal.  ____________________________________________   LABS (all labs ordered are listed, but only abnormal results are displayed)  Labs Reviewed  COMPREHENSIVE METABOLIC PANEL - Abnormal; Notable for the following:       Result Value   Glucose, Bld 119 (*)    Creatinine, Ser 1.06 (*)    All other components within normal limits  CBC  LIPASE, BLOOD  POCT PREGNANCY, URINE  POC URINE PREG, ED   ____________________________________________  EKG  ED ECG REPORT I, Lutie Pickler, the attending physician, personally viewed and interpreted this ECG.  Date: 06/25/2017 EKG Time: 2140 Rate: 70 Rhythm: normal sinus rhythm QRS Axis: normal Intervals: normal ST/T Wave abnormalities: normal Narrative Interpretation: unremarkable  ____________________________________________  RADIOLOGY   ____________________________________________   PROCEDURES  Procedure(s) performed: None  Procedures  Critical Care performed: No  ____________________________________________   INITIAL IMPRESSION / ASSESSMENT AND PLAN / ED COURSE  Pertinent labs & imaging results that were available during my care of the patient were reviewed by me and considered in my medical decision making (see chart for details).  Presents for lightheadedness today. In the setting of heavy menses, with recent introduction of the new occurring. Also some mild to moderate hypertension. The hypertension is new, does have some irregularity of her periods but reports this last 5 days be particularly heavy. Denies pregnancy. Very reassuring abdominal exam. No flank pain. Reports intermittent crampy  discomfort with bleeding. She reports she has had an ultrasound done recently with Dr. Jean Rosenthal to evaluate. Symptomatology does not fit that of torsion. Denies any infectious symptoms. Discussed further, patient has no neurologic cardiac or pulmonary symptoms. EKG reassuring. No pleuritic chest pain, no symptoms to suggest pulmonary embolus. No hypoxia and no tachycardia.  Suspect this may be related to her new occurring and possibly secondary to change in menses, perhaps leading to some lightheadedness. Hemoglobin is normal. Blood work reassuring     Dr. Tiburcio Pea recommends stop nuvaring (discontinue for this month.) Would recommend reevaluation in the office this week and nuvaring could be contributing to her hypertension. Recommends follow-up in office this week for reeval and further decision making with them in the office.   Return precautions and treatment recommendations and follow-up discussed with the patient who is agreeable with the plan.  ____________________________________________   FINAL CLINICAL IMPRESSION(S) / ED DIAGNOSES  Final diagnoses:  Excessive menstruation at puberty  Lightheaded  Elevated blood pressure reading      NEW MEDICATIONS STARTED DURING THIS VISIT:  Discharge Medication List as of 06/24/2017 11:17 PM       Note:  This document was prepared using Dragon voice recognition software and may include unintentional dictation errors.     Sharyn Creamer, MD 06/25/17 [phone]

## 2017-06-24 NOTE — ED Notes (Signed)
EDP at bedside at this time. Pt reporting muscle soreness through shoulders.

## 2017-07-02 ENCOUNTER — Encounter: Payer: Self-pay | Admitting: Obstetrics and Gynecology

## 2017-07-02 ENCOUNTER — Encounter: Payer: Managed Care, Other (non HMO) | Admitting: Obstetrics and Gynecology

## 2017-07-03 NOTE — Progress Notes (Signed)
This encounter was created in error - please disregard.

## 2017-07-25 ENCOUNTER — Emergency Department
Admission: EM | Admit: 2017-07-25 | Discharge: 2017-07-25 | Disposition: A | Discharge status: Home / Self Care | Payer: Managed Care, Other (non HMO) | Attending: Emergency Medicine | Admitting: Emergency Medicine

## 2017-07-25 ENCOUNTER — Encounter: Payer: Self-pay | Admitting: Emergency Medicine

## 2017-07-25 DIAGNOSIS — M5412 Radiculopathy, cervical region: Secondary | ICD-10-CM | POA: Diagnosis not present

## 2017-07-25 DIAGNOSIS — Z79899 Other long term (current) drug therapy: Secondary | ICD-10-CM | POA: Diagnosis not present

## 2017-07-25 DIAGNOSIS — R202 Paresthesia of skin: Secondary | ICD-10-CM | POA: Diagnosis not present

## 2017-07-25 DIAGNOSIS — M79602 Pain in left arm: Secondary | ICD-10-CM | POA: Diagnosis present

## 2017-07-25 LAB — GLUCOSE, CAPILLARY: GLUCOSE-CAPILLARY: 111 mg/dL — AB (ref 65–99)

## 2017-07-25 MED ORDER — IBUPROFEN 600 MG PO TABS: 600.0000 mg | ORAL_TABLET | Freq: Four times a day (QID) | ORAL | 0 refills | Status: DC | PRN

## 2017-07-25 MED ORDER — IBUPROFEN 400 MG PO TABS
600.0000 mg | ORAL_TABLET | Freq: Once | ORAL | Status: AC
  Administered 2017-07-25: 60 mg via ORAL
  Filled 2017-07-25: qty 2

## 2017-07-25 MED ORDER — HYDROCODONE-ACETAMINOPHEN 5-325 MG PO TABS
1.0000 | ORAL_TABLET | Freq: Once | ORAL | Status: AC
  Administered 2017-07-25: 1 via ORAL
  Filled 2017-07-25: qty 1

## 2017-07-25 MED ORDER — PREDNISONE 10 MG PO TABS: 10.0000 mg | ORAL_TABLET | Freq: Every day | ORAL | 0 refills | Status: DC

## 2017-07-25 NOTE — ED Notes (Signed)
Pt signed esignature.  D/c  inst to pt.  

## 2017-07-25 NOTE — ED Provider Notes (Signed)
[facility] Emergency Department Provider Note  Time seen: 10:01 PM  I have reviewed the triage vital signs and the nursing notes.   HISTORY  Chief Complaint Numbness and Arm Pain    HPI Maureen Mcbride is a 26 y.o. female With a past medical history of PE COS, presents to the emergency department for left arm pain. According to the patient for the past 2 days she has been experiencing left arm pain, and pain in her left shoulder blade. States the pain is worse with any type of movement of the left arm. States she was trying to cut tonight and was in tears due to the pain anytime she moved her left arm. Denies any arm swelling. Denies any chest pain or trouble breathing. Denies any fever. Denies any known trauma to the arm. 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  Past Medical History:  Diagnosis Date  . Hyperprolactinemia (HCC)   . PCOS (polycystic ovarian syndrome) 11/2016    Patient Active Problem List   Diagnosis Date Noted  . PCOS (polycystic ovarian syndrome) 05/17/2017  . Hyperprolactinemia (HCC) 05/17/2017    Past Surgical History:  Procedure Laterality Date  . APPENDECTOMY  2009   [facility]  . URETHRAL DILATION  1999   DR. KOWALSKI    Prior to Admission medications   Medication Sig Start Date End Date Taking? Authorizing Provider  etonogestrel-ethinyl estradiol (NUVARING) 0.12-0.015 MG/24HR vaginal ring Insert vaginally and leave in place for 3 consecutive weeks, then remove for 1 week. 05/17/17  Yes Conard Novak, MD  fluticasone [facility]) 50 MCG/ACT nasal spray Place 2 sprays into both nostrils daily. 12/13/15 12/12/16  Hagler, Jami L, PA-C    Allergies  Allergen Reactions  . Penicillins Hives    Family History  Problem Relation Age of Onset  . Colon cancer Maternal Grandfather        60   . Diabetes Mellitus II Paternal Grandmother   . Hypertension Other     Social History Social History  Substance Use Topics  . Smoking status: Never Smoker  . Smokeless tobacco: Never Used  . Alcohol use No    Review of Systems Constitutional: Negative for fever. Cardiovascular: Negative for chest pain. Respiratory: Negative for shortness of breath. Gastrointestinal: Negative for abdominal pain Musculoskeletal: left arm pain Neurological: Negative for headache All other ROS negative  ____________________________________________   PHYSICAL EXAM:  VITAL SIGNS: ED Triage Vitals  Enc Vitals Group     BP 07/25/17 2046 133/89     Pulse Rate 07/25/17 2046 89     Resp 07/25/17 2046 18     Temp 07/25/17 2046 98.2 F (36.8 C)     Temp Source 07/25/17 2046 Oral     SpO2 07/25/17 2046 100 %     Weight 07/25/17 2046 160 lb (72.6 kg)     Height 07/25/17 2046  (1.473 m)     Head Circumference --      Peak Flow --      Pain Score 07/25/17 2045 5     Pain Loc --      Pain Edu? --      Excl. in GC? --     Constitutional: Alert and oriented. Well appearing and in no distress. Eyes: Normal exam ENT   Head: Normocephalic and atraumatic. Cardiovascular: Normal rate, regular  rhythm. No murmur Respiratory: Normal respiratory effort without tachypnea nor retractions. Breath sounds are clear  Gastrointestinal: soft, normal bowel sounds. No distention. Musculoskeletal: normal-appearing extremities, no edema, 2+ radial pulses bilaterally.pain elicited with movement of the left arm or stretching of the left arm Neurologic:  Normal speech and language. No gross focal neurologic deficits. Sensation intact, good grip strengths bilaterally. Skin:  Skin is warm, dry and intact.  Psychiatric: Mood and affect are normal.   ____________________________________________   INITIAL IMPRESSION / ASSESSMENT AND PLAN / ED COURSE  Pertinent labs & imaging results that were available  during my care of the patient were reviewed by me and considered in my medical decision making (see chart for details).  patient presents to the emergency department with left arm pain worse with movement. Patient does have mild to moderate tenderness in the left trapezius. Symptoms are very suggestive of nerve impingement or radiculopathy/neuropraxia. I discussed with the patient a trial of pain medication and a course of prednisone. Patient agreeable to this plan. I also discussed return precautions for any worsening symptoms such as weakness increased numbness, headache confusion or slurred speech.  ____________________________________________   FINAL CLINICAL IMPRESSION(S) / ED DIAGNOSES  radicular pain    Minna AntisPaduchowski, Braxton Vantrease, MD 07/25/17 2204

## 2017-07-25 NOTE — ED Triage Notes (Addendum)
Patient states that she has had intermittent pain and numbness to bilateral arms times 4 days. Patient states that the last 2 days the pain and numbess has been constant. Patient positive radial pulses. Patient requesting to have her blood sugar checked.

## 2017-07-25 NOTE — ED Notes (Signed)
Pt has pain and numbness in left forearm.  Sx for 2 days.  Pt reports worse pain when peeling potatoes tonight.  No swelling noted.  Pt alert.

## 2017-07-25 NOTE — ED Triage Notes (Signed)
Spoke with Dr. Don PerkingVeronese regarding patient. No new orders at this time.

## 2017-08-20 ENCOUNTER — Ambulatory Visit: Payer: Managed Care, Other (non HMO) | Admitting: Obstetrics and Gynecology

## 2017-09-09 ENCOUNTER — Encounter: Payer: Self-pay | Admitting: Emergency Medicine

## 2017-09-09 DIAGNOSIS — M5412 Radiculopathy, cervical region: Secondary | ICD-10-CM | POA: Diagnosis not present

## 2017-09-09 DIAGNOSIS — Z79899 Other long term (current) drug therapy: Secondary | ICD-10-CM | POA: Diagnosis not present

## 2017-09-09 DIAGNOSIS — M545 Low back pain: Secondary | ICD-10-CM | POA: Insufficient documentation

## 2017-09-09 DIAGNOSIS — R200 Anesthesia of skin: Secondary | ICD-10-CM | POA: Diagnosis not present

## 2017-09-09 DIAGNOSIS — M542 Cervicalgia: Secondary | ICD-10-CM | POA: Diagnosis present

## 2017-09-09 LAB — URINALYSIS, COMPLETE (UACMP) WITH MICROSCOPIC
BILIRUBIN URINE: NEGATIVE
Glucose, UA: NEGATIVE mg/dL
Hgb urine dipstick: NEGATIVE
Ketones, ur: NEGATIVE mg/dL
Nitrite: NEGATIVE
PH: 6 (ref 5.0–8.0)
Protein, ur: NEGATIVE mg/dL
SPECIFIC GRAVITY, URINE: 1.018 (ref 1.005–1.030)

## 2017-09-09 LAB — POCT PREGNANCY, URINE: Preg Test, Ur: NEGATIVE

## 2017-09-09 MED ORDER — OXYCODONE-ACETAMINOPHEN 5-325 MG PO TABS
1.0000 | ORAL_TABLET | Freq: Once | ORAL | Status: AC
  Administered 2017-09-09: 1 via ORAL

## 2017-09-09 MED ORDER — OXYCODONE-ACETAMINOPHEN 5-325 MG PO TABS
ORAL_TABLET | ORAL | Status: AC
  Filled 2017-09-09: qty 1

## 2017-09-09 NOTE — ED Triage Notes (Signed)
Pt c/o lower back pain x3 weeks as well as bilateral arm numbness. Pt denies chest pain and SOB. Pt seen for arm numbness x1 months ago. Pt reports she has previous back issues with multiple lumbar fractures and has chronic pain in area since. Pt denies urinary symptoms.

## 2017-09-10 ENCOUNTER — Emergency Department: Payer: Managed Care, Other (non HMO)

## 2017-09-10 ENCOUNTER — Telehealth: Payer: Self-pay

## 2017-09-10 ENCOUNTER — Emergency Department
Admission: EM | Admit: 2017-09-10 | Discharge: 2017-09-10 | Disposition: A | Discharge status: Home / Self Care | Payer: Managed Care, Other (non HMO) | Attending: Emergency Medicine | Admitting: Emergency Medicine

## 2017-09-10 DIAGNOSIS — M5412 Radiculopathy, cervical region: Secondary | ICD-10-CM

## 2017-09-10 MED ORDER — CYCLOBENZAPRINE HCL 10 MG PO TABS: 10.0000 mg | ORAL_TABLET | Freq: Three times a day (TID) | ORAL | 0 refills | Status: DC | PRN

## 2017-09-10 MED ORDER — MORPHINE SULFATE (PF) 2 MG/ML IV SOLN
INTRAVENOUS | Status: AC
  Administered 2017-09-10: 2 mg via INTRAVENOUS
  Filled 2017-09-10: qty 1

## 2017-09-10 MED ORDER — ONDANSETRON HCL 4 MG/2ML IJ SOLN
INTRAMUSCULAR | Status: AC
  Administered 2017-09-10: 4 mg
  Filled 2017-09-10: qty 2

## 2017-09-10 NOTE — ED Notes (Signed)
Patient transported to MRI 

## 2017-09-10 NOTE — Telephone Encounter (Signed)
[facility] is ref pt to [facility].  Their 1st appt is in Jan.  [facility]?  [phone]

## 2017-09-10 NOTE — ED Notes (Signed)
Pt here for chronic back pain that is worse recently and now has progressed to arm numbness. Pt is very tender to the touch and has limited movement.

## 2017-09-10 NOTE — ED Notes (Signed)
Patient is resting comfortably. 

## 2017-09-10 NOTE — ED Notes (Signed)
Family at bedside. 

## 2017-09-10 NOTE — ED Provider Notes (Signed)
[facility] Emergency Department Provider Note   First MD Initiated Contact with Patient 09/10/17 0246     (approximate)  I have reviewed the triage vital signs and the nursing notes.   HISTORY  Chief Complaint Back Pain   HPI Maureen Mcbride is a 26 y.o. female with below list of chronic medical conditions presents to the emergency department with posterior neck pain times months with bilateral hand numbness. Patient also admits tointermittent low back pain 3 weeks maximum intensity of 10 out of 10 current pain score of 7 out of 10. patient denies any radiation of the pain down the legs or buttocks. Patient denies any lower extremity weakness numbness or gait instability. patient was seen in the emergency department and prescribed prednisone and analgesia for suspected cervical radiculopathy. Patient has not had an MRI of the spine performed.  Past Medical History:  Diagnosis Date  . Hyperprolactinemia (HCC)   . PCOS (polycystic ovarian syndrome) 11/2016    Patient Active Problem List   Diagnosis Date Noted  . PCOS (polycystic ovarian syndrome) 05/17/2017  . Hyperprolactinemia (HCC) 05/17/2017    Past Surgical History:  Procedure Laterality Date  . APPENDECTOMY  2009   [facility]  . URETHRAL DILATION  1999   DR. KOWALSKI    Prior to Admission medications   Medication Sig Start Date End Date Taking? Authorizing Provider  cyclobenzaprine (FLEXERIL) 10 MG tablet Take 1 tablet (10 mg total) by mouth 3 (three) times daily as needed. 09/10/17   Darci Current, MD  etonogestrel-ethinyl estradiol (NUVARING) 0.12-0.015 MG/24HR vaginal ring Insert vaginally and leave in place for 3 consecutive weeks, then remove for 1 week. 05/17/17   Conard Novak, MD  fluticasone (FLONASE) 50 MCG/ACT nasal spray Place 2 sprays into both nostrils daily. 12/13/15 12/12/16  Hagler, Jami L, PA-C  ibuprofen (ADVIL,MOTRIN) 600 MG tablet Take 1 tablet (600 mg total) by mouth every  6 (six) hours as needed. 07/25/17   Minna Antis, MD  predniSONE (DELTASONE) 10 MG tablet Take 1 tablet (10 mg total) by mouth daily. Day 1-3: take 4 tablets PO daily Day 4-6: take 3 tablets PO daily Day 7-9: take 2 tablets PO daily Day 10-12: take 1 tablet PO daily 07/25/17   Minna Antis, MD    Allergies Penicillins  Family History  Problem Relation Age of Onset  . Colon cancer Maternal Grandfather        60  . Diabetes Mellitus II Paternal Grandmother   . Hypertension Other     Social History Social History  Substance Use Topics  . Smoking status: Never Smoker  . Smokeless tobacco: Never Used  . Alcohol use No    Review of Systems Constitutional: No fever/chills Eyes: No visual changes. ENT: No sore throat. Cardiovascular: Denies chest pain. Respiratory: Denies shortness of breath. Gastrointestinal: No abdominal pain.  No nausea, no vomiting.  No diarrhea.  No constipation. Genitourinary: Negative for dysuria. Musculoskeletal: positive for neck pain. positive for back pain. Integumentary: Negative for rash. Neurological: Negative for headaches, focal weakness or numbness.positive for bilateral hand numbness   ____________________________________________   PHYSICAL EXAM:  VITAL SIGNS: ED Triage Vitals [09/09/17 2334]  Enc Vitals Group     BP 131/83     Pulse Rate 80     Resp 16     Temp 98 F (36.7 C)     Temp Source Oral     SpO2 99 %     Weight 72.6 kg (160  lb)     Height      Head Circumference      Peak Flow      Pain Score      Pain Loc      Pain Edu?      Excl. in GC?     Constitutional: Alert and oriented. apparent discomfort Eyes: Conjunctivae are normal. PERRL. EOMI. Head: atraumatic Mouth/Throat: Mucous membranes are moist.  Oropharynx non-erythematous. Neck: No stridor. diffuse cervical discomfort with palpation posteriorly Cardiovascular: Normal rate, regular rhythm. Good peripheral circulation. Grossly normal heart  sounds. Respiratory: Normal respiratory effort.  No retractions. Lungs CTAB. Gastrointestinal: Soft and nontender. No distention.  Musculoskeletal: No lower extremity tenderness nor edema. No gross deformities of extremities.Pain with bilateral lumbar paraspinal muscle palpation. Neurologic:  Normal speech and language. No gross focal neurologic deficits are appreciated.  Skin:  Skin is warm, dry and intact. No rash noted. Psychiatric: Mood and affect are normal. Speech and behavior are normal.  ____________________________________________   LABS (all labs ordered are listed, but only abnormal results are displayed)  Labs Reviewed  URINALYSIS, COMPLETE (UACMP) WITH MICROSCOPIC - Abnormal; Notable for the following:       Result Value   Color, Urine YELLOW (*)    APPearance HAZY (*)    Leukocytes, UA MODERATE (*)    Bacteria, UA RARE (*)    Squamous Epithelial / LPF 6-30 (*)    All other components within normal limits  POC URINE PREG, ED  POCT PREGNANCY, URINE    RADIOLOGY I, Emmet N Cornel Werber, personally viewed and evaluated these images (plain radiographs) as part of my medical decision making, as well as reviewing the written report by the radiologist.  Mr Cervical Spine Wo Contrast  Result Date: 09/10/2017 CLINICAL DATA:  26 y/o F; 3 weeks of lower back pain as well as bilateral arm numbness. EXAM: MRI CERVICAL SPINE WITHOUT CONTRAST TECHNIQUE: Multiplanar, multisequence MR imaging of the cervical spine was performed. No intravenous contrast was administered. COMPARISON:  None. FINDINGS: Alignment: Straightening of cervical lordosis without listhesis. Vertebrae: No fracture, evidence of discitis, or bone lesion. Cord: Normal signal and morphology. Posterior Fossa, vertebral arteries, paraspinal tissues: Pituitary fossa mass measuring at least 17 mm, partially visualized. Disc levels: C2-3: No significant disc displacement, foraminal stenosis, or canal stenosis. C3-4: Small disc  bulge with ventral thecal sac effacement and anterior cord contact. No significant foraminal or canal stenosis. C4-5: Small disc bulge with ventral thecal sac effacement and anterior cord contact. No significant foraminal or canal stenosis. C5-6: Minimal disc bulge. No significant foraminal or canal stenosis. C6-7: Minimal disc bulge. No significant foraminal or canal stenosis. C7-T1: No significant disc displacement, foraminal stenosis, or canal stenosis. IMPRESSION: 1. No acute osseous abnormality or abnormal cord signal. 2. No significant canal or foraminal stenosis. 3. Minimal cervical spine discogenic degenerative changes greatest at the C3-4 and C4-5 levels where small disc bulges efface the ventral thecal sac and contact the anterior cord. 4. 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 Pituitary protocol MRI with and without contrast is recommended to further evaluate on a nonemergent basis. Electronically Signed   By: Mitzi HansenLance  Furusawa-Stratton M.D.   On: 09/10/2017 05:10   Mr Lumbar Spine Wo Contrast  Result Date: 09/10/2017 CLINICAL DATA:  26 y/o F; 3 weeks of lower back pain as well as bilateral arm numbness. EXAM: MRI LUMBAR SPINE WITHOUT CONTRAST TECHNIQUE: Multiplanar, multisequence MR imaging of the lumbar spine was performed. No  intravenous contrast was administered. COMPARISON:  None. FINDINGS: Segmentation:  Standard. Alignment:  Physiologic. Vertebrae:  No fracture, evidence of discitis, or bone lesion. Conus medullaris: Extends to the L1-2 level and appears normal. Paraspinal and other soft tissues: Negative. Disc levels: L1-2: No significant disc displacement, foraminal stenosis, or canal stenosis. L2-3: No significant disc displacement, foraminal stenosis, or canal stenosis. L3-4: No significant disc displacement, foraminal stenosis, or canal stenosis. L4-5: No significant disc displacement, foraminal stenosis, or canal stenosis. L5-S1:  No significant disc displacement, foraminal stenosis, or canal stenosis. IMPRESSION: Normal lumbar spine MRI. Electronically Signed   By: Mitzi Hansen M.D.   On: 09/10/2017 05:00     Procedures   ____________________________________________   INITIAL IMPRESSION / ASSESSMENT AND PLAN / ED COURSE  As part of my medical decision making, I reviewed the following data within the electronic MEDICAL RECORD NUMBER 26 year old female presenting with above stated history of physical exam concerning for cervical radiculopathy and potential lumbar radiculopathy as well as such MRI of the cervical and lumbar spine performed. MRI confirmed cervical radiculopathy however no evidence of pathology in the lumbar spine and a such suspect bilateral paraspinal muscle spasms. Patient was given IV morphine in the emergency department 2 mg 2 doses with improvement of pain. Patient will be prescribed Flexeril for home with recommendation follow-up with Dr. Orpah Clinton neurosurgeon on call     ____________________________________________  FINAL CLINICAL IMPRESSION(S) / ED DIAGNOSES  Final diagnoses:  Cervical radiculopathy     MEDICATIONS GIVEN DURING THIS VISIT:  Medications  oxyCODONE-acetaminophen (PERCOCET/ROXICET) 5-325 MG per tablet 1 tablet (1 tablet Oral Given 09/09/17 2341)  ondansetron (ZOFRAN) 4 MG/2ML injection (4 mg  Given 09/10/17 9629)  morphine 2 MG/ML injection (2 mg Intravenous Given 09/10/17 0307)  morphine 2 MG/ML injection (2 mg Intravenous Given 09/10/17 0455)     NEW OUTPATIENT MEDICATIONS STARTED DURING THIS VISIT:  Discharge Medication List as of 09/10/2017  5:51 AM    START taking these medications   Details  cyclobenzaprine (FLEXERIL) 10 MG tablet Take 1 tablet (10 mg total) by mouth 3 (three) times daily as needed., Starting Tue 09/10/2017, Print        Discharge Medication List as of 09/10/2017  5:51 AM      Discharge Medication List as of 09/10/2017  5:51 AM        Note:  This document was prepared using Dragon voice recognition software and may include unintentional dictation errors.    Darci Current, MD 09/10/17 [phone]

## 2017-09-17 NOTE — Telephone Encounter (Signed)
Can you change this referral to [facility] in [place]?

## 2017-09-18 ENCOUNTER — Other Ambulatory Visit: Payer: Self-pay | Admitting: Neurosurgery

## 2017-09-18 DIAGNOSIS — E236 Other disorders of pituitary gland: Secondary | ICD-10-CM

## 2017-09-24 ENCOUNTER — Ambulatory Visit
Admission: RE | Admit: 2017-09-24 | Discharge: 2017-09-24 | Disposition: A | Discharge status: Home / Self Care | Payer: Managed Care, Other (non HMO) | Source: Ambulatory Visit | Attending: Neurosurgery | Admitting: Neurosurgery

## 2017-09-24 DIAGNOSIS — E236 Other disorders of pituitary gland: Secondary | ICD-10-CM

## 2017-09-24 DIAGNOSIS — E237 Disorder of pituitary gland, unspecified: Secondary | ICD-10-CM | POA: Insufficient documentation

## 2017-09-24 MED ORDER — GADOBENATE DIMEGLUMINE 529 MG/ML IV SOLN
7.0000 mL | Freq: Once | INTRAVENOUS | Status: AC | PRN
  Administered 2017-09-24: 7.5 mL via INTRAVENOUS

## 2017-10-08 ENCOUNTER — Encounter: Payer: Self-pay | Admitting: Emergency Medicine

## 2017-10-08 ENCOUNTER — Emergency Department
Admission: EM | Admit: 2017-10-08 | Discharge: 2017-10-08 | Disposition: A | Discharge status: Home / Self Care | Payer: Managed Care, Other (non HMO) | Attending: Emergency Medicine | Admitting: Emergency Medicine

## 2017-10-08 ENCOUNTER — Other Ambulatory Visit: Payer: Self-pay

## 2017-10-08 DIAGNOSIS — R519 Headache, unspecified: Secondary | ICD-10-CM

## 2017-10-08 DIAGNOSIS — R51 Headache: Secondary | ICD-10-CM | POA: Insufficient documentation

## 2017-10-08 MED ORDER — SODIUM CHLORIDE 0.9 % IV BOLUS (SEPSIS)
1000.0000 mL | Freq: Once | INTRAVENOUS | Status: AC
  Administered 2017-10-08: 1000 mL via INTRAVENOUS

## 2017-10-08 MED ORDER — DIPHENHYDRAMINE HCL 50 MG/ML IJ SOLN
25.0000 mg | Freq: Once | INTRAMUSCULAR | Status: AC
  Administered 2017-10-08: 25 mg via INTRAVENOUS
  Filled 2017-10-08: qty 1

## 2017-10-08 MED ORDER — METOCLOPRAMIDE HCL 5 MG/ML IJ SOLN
10.0000 mg | Freq: Once | INTRAMUSCULAR | Status: AC
  Administered 2017-10-08: 10 mg via INTRAVENOUS
  Filled 2017-10-08: qty 2

## 2017-10-08 MED ORDER — DEXAMETHASONE SODIUM PHOSPHATE 10 MG/ML IJ SOLN
10.0000 mg | Freq: Once | INTRAMUSCULAR | Status: AC
  Administered 2017-10-08: 10 mg via INTRAVENOUS
  Filled 2017-10-08: qty 1

## 2017-10-08 NOTE — Discharge Instructions (Signed)
You are evaluated and treated for nonspecific headache, and although no certain cause was found, your exam and evaluation are overall reassuring in the emergency department today.  As we discussed, we chose to hold off on any CT scan habit imaging today.  Return to the emergency room immediately for any new or uncontrolled headache, any vision changes, confusion or altered mental status, weakness, numbness, seizure, trouble finding words, or any other symptoms concerning to you.

## 2017-10-08 NOTE — ED Provider Notes (Signed)
[facility] Emergency Department Provider Note ____________________________________________   I have reviewed the triage vital signs and the triage nursing note.  HISTORY  Chief Complaint Migraine   Historian Patient  HPI Maureen Mcbride is a 26 y.o. female with a history of "migraines "although it does not sound like she is seen in neurology for diagnosis of this, last was about a week ago, and states that she will typically take 1 of her mom's oxycodone tablets and go to sleep and then is better.  This headache has been gradual onset since Sunday when she was working in the [place] shift.  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  Not reporting any focal neurologic complaints, no visual complaints, no weakness, no numbness.  Headache is global, she does have a lot of pressure in the glabella area, and the headache feels global and similar to prior "migraines.  "  No neck pain or fevers.  She does have a history of several pinched nerves in her neck.   Past Medical History:  Diagnosis Date  . Hyperprolactinemia (HCC)   . PCOS (polycystic ovarian syndrome) 11/2016    Patient Active Problem List   Diagnosis Date Noted  . PCOS (polycystic ovarian syndrome) 05/17/2017  . Hyperprolactinemia (HCC) 05/17/2017    Past Surgical History:  Procedure Laterality Date  . APPENDECTOMY  2009   [facility]  . URETHRAL DILATION  1999   DR. KOWALSKI    Prior to Admission medications   Medication Sig Start Date End Date Taking? Authorizing Provider  cyclobenzaprine (FLEXERIL) 10 MG tablet Take 1 tablet (10 mg total) by mouth 3 (three) times daily as needed. 09/10/17   Brown, Martindale N, MD  etonogestrel-ethinyl estradiol (NUVARING) 0.12-0.015 MG/24HR vaginal ring Insert vaginally and  leave in place for 3 consecutive weeks, then remove for 1 week. 05/17/17   Jackson, Stephen D, MD  fluticasone (FLONASE) 50 MCG/ACT nasal spray Place 2 sprays into both nostrils daily. 12/13/15 12/12/16  Hagler, Jami L, PA-C  ibuprofen (ADVIL,MOTRIN) 600 MG tablet Take 1 tablet (600 mg total) by mouth every 6 (six) hours as needed. 07/25/17   Paduchowski, Kevin, MD  predniSONE (DELTASONE) 10 MG tablet Take 1 tablet (10 mg total) by mouth daily. Day 1-3: take 4 tablets PO daily Day 4-6: take 3 tablets PO daily Day 7-9: take 2 tablets PO daily Day 10-12: take 1 tablet PO daily 07/25/17   Paduchowski, Kevin, MD    Allergies  Allergen Reactions  . Penicillins Hives    Family History  Problem Relation Age of Onset  . Colon cancer Maternal Grandfather        60   . Diabetes Mellitus II Paternal Grandmother   . Hypertension Other     Social History Social History   Tobacco Use  . Smoking status: Never Smoker  . Smokeless tobacco: Never Used  Substance Use Topics  . Alcohol use: No  . Drug use: No    Review of Systems  Constitutional: Negative for fever. Eyes: Negative for visual changes. ENT: Negative for sore throat. Cardiovascular: Negative for chest pain. Respiratory: Negative for shortness of breath. Gastrointestinal: Negative for abdominal pain, vomiting and diarrhea. Genitourinary: Negative for dysuria. Musculoskeletal: Negative for back pain. Skin: Negative for rash. Neurological: Positive for headache.  ____________________________________________   PHYSICAL EXAM:  VITAL SIGNS: ED Triage Vitals  Enc Vitals Group     BP 10/08/17 0924 124/79     Pulse Rate 10/08/17 0924 (!) 105     Resp 10/08/17 0924 18     Temp 10/08/17 0924 98.4 F (36.9 C)     Temp Source 10/08/17 0924 Oral     SpO2 10/08/17 0924 98 %     Weight 10/08/17 0925 162 lb (73.5 kg)     Height 10/08/17 0925 4\' 10"  (1.473 m)     Head Circumference --      Peak Flow --      Pain Score 10/08/17 0939 9      Pain Loc --      Pain Edu? --      Excl. in GC? --      Constitutional: Alert and oriented. Well appearing and in no distress. HEENT   Head: Normocephalic and atraumatic.      Eyes: Conjunctivae are normal. Pupils equal and round.       Ears:         Nose: No congestion/rhinnorhea.   Mouth/Throat: Mucous membranes are moist.   Neck: No stridor.  Neck supple nontender. Cardiovascular/Chest: Normal rate, regular rhythm.  No murmurs, rubs, or gallops. Respiratory: Normal respiratory effort without tachypnea nor retractions. Breath sounds are clear and equal bilaterally. No wheezes/rales/rhonchi. Gastrointestinal: Soft. No distention, no guarding, no rebound. Nontender.  Obese. Genitourinary/rectal:Deferred Musculoskeletal: Nontender with normal range of motion in all extremities. No joint effusions.  No lower extremity tenderness.  No edema. Neurologic: No facial droop.  No slurred speech.  Cranial nerves II through X intact.  Normal speech and language. No gross or focal neurologic deficits are appreciated. Skin:  Skin is warm, dry and intact. No rash noted. Psychiatric: Mood and affect are normal. Speech and behavior are normal. Patient exhibits appropriate insight and judgment.   ____________________________________________  LABS (pertinent positives/negatives) I, Governor Rooksebecca Trang Bouse, MD the attending physician have reviewed the labs noted below.  Labs Reviewed - No data to display  ____________________________________________    EKG I, Governor Rooksebecca Nicholas Trompeter, MD, the attending physician have personally viewed and interpreted all ECGs.  None ____________________________________________  RADIOLOGY All Xrays were viewed by me.  Imaging interpreted by Radiologist, and I, Governor Rooksebecca Kinsler Soeder, MD the attending physician have reviewed the radiologist interpretation noted below.  None __________________________________________  PROCEDURES  Procedure(s) performed: None  Critical  Care performed: None   ____________________________________________  ED COURSE / ASSESSMENT AND PLAN  Pertinent labs & imaging results that were available during my care of the patient were reviewed by me and considered in my medical decision making (see chart for details).   Patient is describing a gradual onset global headache similar to prior headaches.  Although she does not have a formal diagnosis of migraines, she states she has "migraines."  She also has this pituitary tumor for which she is supposed to be operated on due to local invasion in December.  She is not reporting any new neurologic symptoms or any vision changes, as I discussed with her my preference was to try to avoid CT scanning and avoid radiation at this point time.  She is very on board with that plan.  I am going to initially treat with saline bolus, Benadryl, Reglan, and dexamethasone.   Repeat evaluation at around 135, patient states headache is gone and she would like to go home.  She does look bright and feels better, and I agree that I would recommend additional  head imaging at this point time.    DIFFERENTIAL DIAGNOSIS: Differential diagnosis includes, but is not limited to, intracranial hemorrhage, meningitis/encephalitis, previous head trauma, cavernous venous thrombosis, tension headache, temporal arteritis, migraine or migraine equivalent, idiopathic intracranial hypertension, and non-specific headache.  CONSULTATIONS:  None   Patient / Family / Caregiver informed of clinical course, medical decision-making process, and agree with plan.   I discussed return precautions, follow-up instructions, and discharge instructions with patient and/or family.  Discharge Instructions : You are evaluated and treated for nonspecific headache, and although no certain cause was found, your exam and evaluation are overall reassuring in the emergency department today.  As we discussed, we chose to hold off on any CT  scan habit imaging today.  Return to the emergency room immediately for any new or uncontrolled headache, any vision changes, confusion or altered mental status, weakness, numbness, seizure, trouble finding words, or any other symptoms concerning to you.    ___________________________________________   FINAL CLINICAL IMPRESSION(S) / ED DIAGNOSES   Final diagnoses:  Headache, unspecified headache type      ___________________________________________  ED Discharge Orders    None            Note: This dictation was prepared with Dragon dictation. Any transcriptional errors that result from this process are unintentional    Governor Rooks, MD 10/08/17 1337

## 2017-10-08 NOTE — ED Triage Notes (Addendum)
Headache x2 days with hx of similar headaches, "it feels like pressure" , + photophobia, dizziness.  [facility].

## 2017-12-04 ENCOUNTER — Ambulatory Visit (INDEPENDENT_AMBULATORY_CARE_PROVIDER_SITE_OTHER): Payer: Managed Care, Other (non HMO) | Admitting: Obstetrics and Gynecology

## 2017-12-04 ENCOUNTER — Encounter: Payer: Self-pay | Admitting: Obstetrics and Gynecology

## 2017-12-04 VITALS — BP 118/76 | Ht <= 58 in | Wt 156.0 lb

## 2017-12-04 DIAGNOSIS — E282 Polycystic ovarian syndrome: Secondary | ICD-10-CM | POA: Diagnosis not present

## 2017-12-04 DIAGNOSIS — Z01419 Encounter for gynecological examination (general) (routine) without abnormal findings: Secondary | ICD-10-CM | POA: Diagnosis not present

## 2017-12-04 DIAGNOSIS — Z113 Encounter for screening for infections with a predominantly sexual mode of transmission: Secondary | ICD-10-CM

## 2017-12-04 DIAGNOSIS — Z1339 Encounter for screening examination for other mental health and behavioral disorders: Secondary | ICD-10-CM | POA: Diagnosis not present

## 2017-12-04 DIAGNOSIS — Z3009 Encounter for other general counseling and advice on contraception: Secondary | ICD-10-CM | POA: Diagnosis not present

## 2017-12-04 DIAGNOSIS — Z1331 Encounter for screening for depression: Secondary | ICD-10-CM

## 2017-12-04 MED ORDER — MEDROXYPROGESTERONE ACETATE 10 MG PO TABS: 10.0000 mg | ORAL_TABLET | Freq: Every day | ORAL | 0 refills | Status: DC

## 2017-12-04 NOTE — Progress Notes (Signed)
Gynecology Annual Exam   PCP: Patient, No Pcp Per  Chief Complaint  Patient presents with  . Annual Exam    History of Present Illness:  [name] is a 27 y.o. G0P0000 who LMP was Patient's last menstrual period was 07/04/2017., presents today for her annual examination.  Her menses are infrequent still. She noted elevated blood pressure when taking NuvaRing. She has had no menses since August.  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 She is still followed by endocrinology for this.   She is single partner, contraception - barrier method.  Last Pap: 11/2016  Results were: no abnormalities /neg HPV DNA not done Hx of STDs: none  There is no FH of breast cancer. There is no FH of ovarian cancer. The patient does not do self-breast exams.  Tobacco use: The patient denies current or previous tobacco use. Alcohol use: social drinker Exercise: moderately active  The patient wears seatbelts: sometimes.   The patient reports that domestic violence in her life is absent.   Patient is a 27 y.o. G0P0000 presenting for contraception consult.  She is currently on none and desiring to start IUD.  She has a past medical history significant for hyperprolactinemia, history of elevated blood pressure while taking NuvaRing.  She specifically denies a history of migraine with aura, chronic hypertension, history of DVT/PE and smoking.  Reported Patient's last menstrual period was 07/04/2017.  Past Medical History:  Diagnosis Date  . Hyperprolactinemia (HCC)   . PCOS (polycystic ovarian syndrome) 11/2016    Past Surgical History:  Procedure Laterality Date  . APPENDECTOMY  2009   [facility]  . URETHRAL DILATION  1999   DR. KOWALSKI    Current Outpatient Medications on File Prior to Visit  Medication Sig Dispense Refill  . hydrocortisone (CORTEF) 5 MG tablet Take 5 mg by mouth daily.    . fluticasone (FLONASE) 50 MCG/ACT nasal spray Place 2 sprays into both  nostrils daily. 16 g 0   No current facility-administered medications on file prior to visit.      Allergies  Allergen Reactions  . Penicillins Hives   Obstetric History: G0P0000  Social History   Socioeconomic History  . Marital status: Single    Spouse name: Not on file  . Number of children: 0  . Years of education: 11  . Highest education level: Not on file  Social Needs  . Financial resource strain: Not on file  . Food insecurity - worry: Not on file  . Food insecurity - inability: Not on file  . Transportation needs - medical: Not on file  . Transportation needs - non-medical: Not on file  Occupational History  . Not on file  Tobacco Use  . Smoking status: Never Smoker  . Smokeless tobacco: Never Used  Substance and Sexual Activity  . Alcohol use: No  . Drug use: No  . Sexual activity: Yes    Birth control/protection: None  Other Topics Concern  . Not on file  Social History Narrative  . Not on file    Family History  Problem Relation Age of Onset  . Colon cancer Maternal Grandfather        60  . Diabetes Mellitus II Paternal Grandmother   . Hypertension Other     Review of Systems  Constitutional: Negative.   HENT: Negative.   Eyes: Negative.   Respiratory: Negative.   Cardiovascular: Negative.   Gastrointestinal: Negative.   Genitourinary: Negative.  Musculoskeletal: Negative.   Skin: Negative.   Neurological: Negative.   Psychiatric/Behavioral: Negative.      Physical Exam BP 118/76   Ht 4\' 10"  (1.473 m)   Wt 156 lb (70.8 kg)   LMP 07/04/2017   BMI 32.60 kg/m    Physical Exam  Constitutional: She is oriented to person, place, and time. She appears well-developed and well-nourished. No distress.  Genitourinary: Uterus normal. Pelvic exam was performed with patient supine. There is no rash, tenderness, lesion or injury on the right labia. There is no rash, tenderness, lesion or injury on the left labia. No erythema, tenderness or  bleeding in the vagina. No signs of injury around the vagina. No vaginal discharge found. Right adnexum does not display mass, does not display tenderness and does not display fullness. Left adnexum does not display mass, does not display tenderness and does not display fullness. Cervix does not exhibit motion tenderness, lesion, discharge or polyp.   Uterus is mobile and anteverted. Uterus is not enlarged, tender or exhibiting a mass.  HENT:  Head: Normocephalic and atraumatic.  Eyes: EOM are normal. No scleral icterus.  Neck: Normal range of motion. Neck supple. No thyromegaly present.  Cardiovascular: Normal rate and regular rhythm. Exam reveals no gallop and no friction rub.  No murmur heard. Pulmonary/Chest: Effort normal and breath sounds normal. No respiratory distress. She has no wheezes. She has no rales. Right breast exhibits no inverted nipple, no mass, no nipple discharge, no skin change and no tenderness. Left breast exhibits no inverted nipple, no mass, no nipple discharge, no skin change and no tenderness.  Abdominal: Soft. Bowel sounds are normal. She exhibits no distension and no mass. There is no tenderness. There is no rebound and no guarding.  Musculoskeletal: Normal range of motion. She exhibits no edema or tenderness.  Lymphadenopathy:    She has no cervical adenopathy.       Right: No inguinal adenopathy present.       Left: No inguinal adenopathy present.  Neurological: She is alert and oriented to person, place, and time. No cranial nerve deficit.  Skin: Skin is warm and dry. No rash noted. No erythema.  Psychiatric: She has a normal mood and affect. Her behavior is normal. Judgment normal.   Female chaperone present for pelvic and breast  portions of the physical exam  Results: AUDIT Questionnaire (screen for alcoholism): 3 PHQ-9: 1  Urine pregnancy test: negative  Assessment: 27 y.o. G0P0000 female here for routine annual gynecologic  examination  Plan: Problem List Items Addressed This Visit      Endocrine   PCOS (polycystic ovarian syndrome) (Chronic)   Relevant Medications   medroxyPROGESTERone (PROVERA) 10 MG tablet    Other Visit Diagnoses    Women's annual routine gynecological examination    -  Primary   Screening for depression       Screening for alcoholism       Screen for STD (sexually transmitted disease)       Relevant Orders   GC/Chlamydia Probe Amp   Encounter for other general counseling or advice on contraception         Screening: -- Blood pressure screen normal -- Weight screening: obese: discussed management options, including lifestyle, dietary, and exercise. -- Depression screening negative (PHQ-9) -- Nutrition: normal -- cholesterol screening: not due for screening -- osteoporosis screening: not due -- tobacco screening: not using -- alcohol screening: AUDIT questionnaire indicates low-risk usage. -- family history of breast cancer screening: done.  not at high risk. -- no evidence of domestic violence or intimate partner violence. -- STD screening: gonorrhea/chlamydia NAAT collected -- pap smear not collected per ASCCP guidelines -- HPV vaccination series: has been offered. Considering.   Contraception counseling:  Reviewed all forms of birth control options available including abstinence; over the counter/barrier methods; hormonal contraceptive medication including pill, patch, ring, injection,contraceptive implant; hormonal and nonhormonal IUDs; permanent sterilization options including vasectomy and the various tubal sterilization modalities. Risks and benefits reviewed.  Questions were answered.  Information was given to patient to review.  [facility]  Will placed after her withdrawal bleed from Provera.   15 minutes spent in face to face discussion with > 50% spent in counseling, management, and coordination of care of her contraceptive management.   PCOS: for  hyperandrogenemia symptoms will get her to initially discuss with her endocrinologist.    Thomasene MohairStephen Rion Catala, MD 12/04/2017 1:33 PM

## 2017-12-06 LAB — GC/CHLAMYDIA PROBE AMP
Chlamydia trachomatis, NAA: NEGATIVE
Neisseria gonorrhoeae by PCR: NEGATIVE

## 2017-12-18 ENCOUNTER — Encounter: Payer: Self-pay | Admitting: Obstetrics and Gynecology

## 2017-12-23 ENCOUNTER — Telehealth: Payer: Self-pay | Admitting: Obstetrics and Gynecology

## 2017-12-23 NOTE — Telephone Encounter (Signed)
[facility]

## 2017-12-27 ENCOUNTER — Encounter: Payer: Self-pay | Admitting: Obstetrics and Gynecology

## 2017-12-27 ENCOUNTER — Ambulatory Visit (INDEPENDENT_AMBULATORY_CARE_PROVIDER_SITE_OTHER): Payer: Managed Care, Other (non HMO) | Admitting: Obstetrics and Gynecology

## 2017-12-27 VITALS — BP 118/74 | Ht <= 58 in | Wt 157.0 lb

## 2017-12-27 DIAGNOSIS — Z30430 Encounter for insertion of intrauterine contraceptive device: Secondary | ICD-10-CM

## 2017-12-27 MED ORDER — LEVONORGESTREL 19.5 MG IU IUD: 1.0000 | INTRAUTERINE_SYSTEM | Freq: Once | INTRAUTERINE | 0 refills | Status: DC

## 2017-12-27 NOTE — Progress Notes (Signed)
    IUD Insertion Procedure Note Maureen Mcbride(Kyleena)  Patient identified, informed consent performed, consent signed.   Discussed risks of irregular bleeding, cramping, infection, malpositioning, expulsion or uterine perforation of the IUD (1:1000 placements)  which may require further procedure such as laparoscopy.  IUD while effective at preventing pregnancy do not prevent transmission of sexually transmitted diseases and use of barrier methods for this purpose was discussed. Time out was performed.  Urine pregnancy test negative.  Speculum placed in the vagina.  Cervix visualized.  Cleaned with Betadine x 2.  Grasped anteriorly with a single tooth tenaculum.  Uterus sounded to 7 cm. IUD placed per manufacturer's recommendations.  Strings trimmed to 3 cm. Tenaculum was removed, good hemostasis noted.  Patient tolerated procedure well.   Patient was given post-procedure instructions.  She was advised to have backup contraception for one week.  Patient was also asked to check IUD strings periodically and follow up in 4 weeks for IUD check.   Thomasene MohairStephen Petronella Shuford, MD 12/27/2017 9:04 AM

## 2018-01-14 NOTE — Telephone Encounter (Signed)
Maureen Mcbride received 12/27/17

## 2018-01-24 ENCOUNTER — Ambulatory Visit: Payer: Managed Care, Other (non HMO) | Admitting: Obstetrics and Gynecology

## 2018-05-14 ENCOUNTER — Ambulatory Visit: Payer: Managed Care, Other (non HMO) | Admitting: Obstetrics and Gynecology

## 2018-05-14 ENCOUNTER — Encounter: Payer: Self-pay | Admitting: Obstetrics and Gynecology

## 2018-05-14 VITALS — BP 112/76 | Ht <= 58 in | Wt 178.0 lb

## 2018-05-14 DIAGNOSIS — R1032 Left lower quadrant pain: Secondary | ICD-10-CM | POA: Diagnosis not present

## 2018-05-14 DIAGNOSIS — Z30431 Encounter for routine checking of intrauterine contraceptive device: Secondary | ICD-10-CM

## 2018-05-14 NOTE — Progress Notes (Signed)
   IUD String Check  Subjctive: [name] presents for IUD string check.  She had a Kyleena placed 4 months ago.  Since placement of her IUD she had some vaginal bleeding.  She initially had minimal vaginal bleeding. In the past few days she has had an increase in vaginal bleeding, particularly after intercourse.  She reports cramping or discomfort.  She has had intercourse since placement.  She has not checked the strings.  She denies any fever, chills, nausea, vomiting.  She is concerned that she is having new pain and increased bleeding that there is something wrong with the device.   Objective: BP 112/76 (BP Location: Left Arm, Patient Position: Sitting, Cuff Size: Normal)   Ht 4\' 10"  (1.473 m)   Wt 178 lb (80.7 kg)   BMI 37.20 kg/m  Physical Exam  Constitutional: She is oriented to person, place, and time. She appears well-developed and well-nourished. No distress.  HENT:  Head: Normocephalic and atraumatic.  Nose: Nose normal.  Eyes: Conjunctivae are normal.  Neck: Normal range of motion.  Cardiovascular: Normal rate, regular rhythm and normal heart sounds.  Pulmonary/Chest: Effort normal and breath sounds normal.  Abdominal: Soft. She exhibits no distension. There is tenderness (LLQ, mild). There is no rebound and no guarding.  Genitourinary: Vagina normal and uterus normal. Pelvic exam was performed with patient supine. There is no rash, tenderness or lesion on the right labia. There is no rash, tenderness or lesion on the left labia. Uterus is not tender. Cervix exhibits no motion tenderness and no discharge. Right adnexum displays no mass and no tenderness. Left adnexum displays tenderness. Left adnexum displays no mass. No vaginal discharge found.  Genitourinary Comments: IUD strings visualized and are normal length. IUD not visible nor palpable on bimanual.   Musculoskeletal: Normal range of motion.  Lymphadenopathy:       Right: No inguinal adenopathy present.   Left: No inguinal adenopathy present.  Neurological: She is alert and oriented to person, place, and time.  Skin: Skin is warm and dry. No rash noted.  Psychiatric: She has a normal mood and affect. Her behavior is normal. Judgment normal.    Female chaperone was present for the entirety of the pelvic exam  Assessment: 27 y.o. year old female status post prior [facility] placement 4 months ago, with new-onset LLQ pain and increased vaginal bleeding.  Plan: Will obtain pelvic ultrasound to assess placement and other potential pelvic pathology.   15 minutes spent in face to face discussion with > 50% spent in counseling, management, and coordination of care for her newly-placed IUD.  Risks and benefits of IUD discussed including the risks of irregular bleeding, cramping, infection, malpositioning, expulsion, which may require further procedures such as laparoscopy.  IUDs while effective at preventing pregnancy do not prevent transmission of sexually transmitted diseases and use of barrier methods for this purpose was discussed.  Low overall incidence of failure with 99.7% efficacy rate in typical use.    Thomasene MohairStephen Emerald Gehres, MD 05/14/2018 10:53 AM

## 2018-05-23 ENCOUNTER — Ambulatory Visit: Payer: Managed Care, Other (non HMO) | Admitting: Obstetrics and Gynecology

## 2018-05-23 ENCOUNTER — Other Ambulatory Visit: Payer: Managed Care, Other (non HMO)

## 2018-06-16 ENCOUNTER — Emergency Department
Admission: EM | Admit: 2018-06-16 | Discharge: 2018-06-16 | Disposition: A | Discharge status: Home / Self Care | Payer: Managed Care, Other (non HMO) | Attending: Emergency Medicine | Admitting: Emergency Medicine

## 2018-06-16 ENCOUNTER — Encounter: Payer: Self-pay | Admitting: Emergency Medicine

## 2018-06-16 DIAGNOSIS — Z79899 Other long term (current) drug therapy: Secondary | ICD-10-CM | POA: Insufficient documentation

## 2018-06-16 DIAGNOSIS — Y999 Unspecified external cause status: Secondary | ICD-10-CM | POA: Diagnosis not present

## 2018-06-16 DIAGNOSIS — S51841A Puncture wound with foreign body of right forearm, initial encounter: Secondary | ICD-10-CM | POA: Diagnosis not present

## 2018-06-16 DIAGNOSIS — S50851A Superficial foreign body of right forearm, initial encounter: Secondary | ICD-10-CM

## 2018-06-16 DIAGNOSIS — W458XXA Other foreign body or object entering through skin, initial encounter: Secondary | ICD-10-CM | POA: Insufficient documentation

## 2018-06-16 DIAGNOSIS — Y929 Unspecified place or not applicable: Secondary | ICD-10-CM | POA: Diagnosis not present

## 2018-06-16 DIAGNOSIS — Y9389 Activity, other specified: Secondary | ICD-10-CM | POA: Insufficient documentation

## 2018-06-16 MED ORDER — NAPROXEN 500 MG PO TABS: 500.0000 mg | ORAL_TABLET | Freq: Two times a day (BID) | ORAL | Status: DC

## 2018-06-16 MED ORDER — SULFAMETHOXAZOLE-TRIMETHOPRIM 800-160 MG PO TABS: 1.0000 | ORAL_TABLET | Freq: Two times a day (BID) | ORAL | 0 refills | Status: DC

## 2018-06-16 MED ORDER — SULFAMETHOXAZOLE-TRIMETHOPRIM 800-160 MG PO TABS
1.0000 | ORAL_TABLET | Freq: Once | ORAL | Status: AC
  Administered 2018-06-16: 1 via ORAL
  Filled 2018-06-16: qty 1

## 2018-06-16 MED ORDER — LIDOCAINE HCL (PF) 1 % IJ SOLN
INTRAMUSCULAR | Status: AC
  Filled 2018-06-16: qty 5

## 2018-06-16 MED ORDER — TRAMADOL HCL 50 MG PO TABS
50.0000 mg | ORAL_TABLET | Freq: Once | ORAL | Status: AC
  Administered 2018-06-16: 50 mg via ORAL
  Filled 2018-06-16: qty 1

## 2018-06-16 NOTE — ED Provider Notes (Signed)
[facility] Department Provider Note   ____________________________________________   First MD Initiated Contact with Patient 06/16/18 1512     (approximate)  I have reviewed the triage vital signs and the nursing notes.   HISTORY  Chief Complaint Foreign Body    HPI Maureen Mcbride is a 27 y.o. female patient presents with foreign body to the right forearm.  Patient has a toothpick embedded in the proximal radius of the right arm.  Patient rates the pain as a constant 3/10.  Pain increases to a 10 OT with trying to remove the foreign body.  Patient denies loss of sensation or loss of function.  No palliative measures prior to arrival.  Incident occurred approximately 1 hour ago.  Past Medical History:  Diagnosis Date  . Hyperprolactinemia (HCC)   . PCOS (polycystic ovarian syndrome) 11/2016    Patient Active Problem List   Diagnosis Date Noted  . PCOS (polycystic ovarian syndrome) 05/17/2017  . Hyperprolactinemia (HCC) 05/17/2017    Past Surgical History:  Procedure Laterality Date  . APPENDECTOMY  2009   [facility]  . URETHRAL DILATION  1999   DR. KOWALSKI    Prior to Admission medications   Medication Sig Start Date End Date Taking? Authorizing Provider  fluticasone (FLONASE) 50 MCG/ACT nasal spray Place 2 sprays into both nostrils daily. 12/13/15 12/12/16  Hagler, Jami L, PA-C  hydrocortisone (CORTEF) 5 MG tablet Take 5 mg by mouth daily.    [provider]  ibuprofen (ADVIL,MOTRIN) 200 MG tablet Take 200 mg by mouth every 6 (six) hours as needed.    [provider]  Levonorgestrel (KYLEENA) 19.5 MG IUD 1 Device by Intrauterine route once for 1 dose. 12/27/17 12/27/17  Conard Novak, MD  medroxyPROGESTERone (PROVERA) 10 MG tablet Take 1 tablet (10 mg total) by mouth daily for 10 days. 12/04/17 12/14/17  Conard Novak, MD  naproxen (NAPROSYN) 500 MG tablet Take 1 tablet (500 mg total) by mouth 2 (two) times daily with  a meal. 06/16/18   Joni Reining, PA-C  sulfamethoxazole-trimethoprim (BACTRIM DS,SEPTRA DS) 800-160 MG tablet Take 1 tablet by mouth 2 (two) times daily. 06/16/18   Joni Reining, PA-C  sulfamethoxazole-trimethoprim (BACTRIM DS,SEPTRA DS) 800-160 MG tablet Take 1 tablet by mouth 2 (two) times daily. 06/16/18   Joni Reining, PA-C    Allergies Penicillins  Family History  Problem Relation Age of Onset  . Colon cancer Maternal Grandfather        60  . Diabetes Mellitus II Paternal Grandmother   . Hypertension Other     Social History Social History   Tobacco Use  . Smoking status: Never Smoker  . Smokeless tobacco: Never Used  Substance Use Topics  . Alcohol use: No  . Drug use: No    Review of Systems  Constitutional: No fever/chills Eyes: No visual changes. ENT: No sore throat. Cardiovascular: Denies chest pain. Respiratory: Denies shortness of breath. Gastrointestinal: No abdominal pain.  No nausea, no vomiting.  No diarrhea.  No constipation. Genitourinary: Negative for dysuria. Musculoskeletal: Negative for back pain. Skin: Negative for rash. Neurological: Negative for headaches, focal weakness or numbness. Endocrine:PCOS Allergic/Immunilogical: Penicillin ____________________________________________   PHYSICAL EXAM:  VITAL SIGNS: ED Triage Vitals [06/16/18 1431]  Enc Vitals Group     BP (!) 144/98     Pulse Rate 87     Resp 18     Temp 98.5 F (36.9 C)     Temp Source  Oral     SpO2 100 %     Weight 170 lb (77.1 kg)     Height 4\' 10"  (1.473 m)     Head Circumference      Peak Flow      Pain Score 3     Pain Loc      Pain Edu?      Excl. in GC?    Constitutional: Alert and oriented. Well appearing and in no acute distress. Cardiovascular: Normal rate, regular rhythm. Grossly normal heart sounds.  Good peripheral circulation. Respiratory: Normal respiratory effort.  No retractions. Lungs CTAB. Musculoskeletal: No lower extremity tenderness nor  edema.  No joint effusions. Neurologic:  Normal speech and language. No gross focal neurologic deficits are appreciated. No gait instability. Skin:  Skin is warm, dry and intact. No rash noted. Psychiatric: Mood and affect are normal. Speech and behavior are normal.  ____________________________________________   LABS (all labs ordered are listed, but only abnormal results are displayed)  Labs Reviewed - No data to display ____________________________________________  EKG   ____________________________________________  RADIOLOGY  ED MD interpretation:    Official radiology report(s): No results found.  ____________________________________________   PROCEDURES  Procedure(s) performed: None  .Foreign Body Removal Date/Time: 06/16/2018 3:42 PM Performed by: Joni ReiningSmith, Ronald K, PA-C Authorized by: Joni ReiningSmith, Ronald K, PA-C  Consent: Verbal consent obtained. Risks and benefits: risks, benefits and alternatives were discussed Consent given by: patient Patient understanding: patient states understanding of the procedure being performed Patient consent: the patient's understanding of the procedure matches consent given Procedure consent: procedure consent matches procedure scheduled Patient identity confirmed: verbally with patient Body area: skin General location: upper extremity Location details: right forearm Anesthesia: local infiltration  Anesthesia: Local Anesthetic: lidocaine 1% without epinephrine  Sedation: Patient sedated: no  Patient restrained: no Removal mechanism: forceps Dressing: dressing applied Depth: deep Complexity: simple Post-procedure assessment: foreign body removed    Critical Care performed: No  ____________________________________________   INITIAL IMPRESSION / ASSESSMENT AND PLAN / ED COURSE  As part of my medical decision making, I reviewed the following data within the electronic MEDICAL RECORD NUMBER    Patient presents with tooth  pain embedded in her right forearm.  Foreign body removal under sterile conditions.  Patient given discharge care instruction advised take medication as directed.  Patient advised to follow-up PCP.   ____________________________________________   FINAL CLINICAL IMPRESSION(S) / ED DIAGNOSES  Final diagnoses:  Foreign body in right forearm, initial encounter     ED Discharge Orders        Ordered    sulfamethoxazole-trimethoprim (BACTRIM DS,SEPTRA DS) 800-160 MG tablet  2 times daily     06/16/18 1530    naproxen (NAPROSYN) 500 MG tablet  2 times daily with meals,   Status:  Discontinued     06/16/18 1530    naproxen (NAPROSYN) 500 MG tablet  2 times daily with meals     06/16/18 1539    sulfamethoxazole-trimethoprim (BACTRIM DS,SEPTRA DS) 800-160 MG tablet  2 times daily     06/16/18 1539       Note:  This document was prepared using Dragon voice recognition software and may include unintentional dictation errors.    Joni ReiningSmith, Ronald K, PA-C 06/16/18 1544    Schaevitz, Myra Rudeavid Matthew, MD 06/17/18 [phone]

## 2018-06-16 NOTE — Discharge Instructions (Addendum)
Follow discharge care instruction take medication as directed. °

## 2018-06-16 NOTE — ED Triage Notes (Signed)
Patient presents to the ED with toothpick stuck in her right forearm.  Patient states her husband keeps his toothpicks stuck into a post.  Patient states she went to pick up her pocketbook and toothpick jammed into patient's arm.  Patient states she attempted to remove it but was unable.

## 2018-07-23 ENCOUNTER — Encounter: Payer: Self-pay | Admitting: Emergency Medicine

## 2018-07-23 ENCOUNTER — Emergency Department
Admission: EM | Admit: 2018-07-23 | Discharge: 2018-07-23 | Disposition: A | Discharge status: Home / Self Care | Payer: Managed Care, Other (non HMO) | Attending: Emergency Medicine | Admitting: Emergency Medicine

## 2018-07-23 ENCOUNTER — Emergency Department: Payer: Managed Care, Other (non HMO)

## 2018-07-23 ENCOUNTER — Other Ambulatory Visit: Payer: Self-pay

## 2018-07-23 DIAGNOSIS — N200 Calculus of kidney: Secondary | ICD-10-CM

## 2018-07-23 DIAGNOSIS — E282 Polycystic ovarian syndrome: Secondary | ICD-10-CM | POA: Diagnosis not present

## 2018-07-23 DIAGNOSIS — R1084 Generalized abdominal pain: Secondary | ICD-10-CM | POA: Insufficient documentation

## 2018-07-23 LAB — CBC WITH DIFFERENTIAL/PLATELET
Basophils Absolute: 0 10*3/uL (ref 0–0.1)
Basophils Relative: 1 %
EOS ABS: 0.1 10*3/uL (ref 0–0.7)
EOS PCT: 2 %
HCT: 42.3 % (ref 35.0–47.0)
HEMOGLOBIN: 15 g/dL (ref 12.0–16.0)
Lymphocytes Relative: 22 %
Lymphs Abs: 1.3 10*3/uL (ref 1.0–3.6)
MCH: 30.4 pg (ref 26.0–34.0)
MCHC: 35.4 g/dL (ref 32.0–36.0)
MCV: 85.8 fL (ref 80.0–100.0)
MONOS PCT: 9 %
Monocytes Absolute: 0.5 10*3/uL (ref 0.2–0.9)
NEUTROS PCT: 66 %
Neutro Abs: 4 10*3/uL (ref 1.4–6.5)
PLATELETS: 243 10*3/uL (ref 150–440)
RBC: 4.93 MIL/uL (ref 3.80–5.20)
RDW: 12.9 % (ref 11.5–14.5)
WBC: 5.9 10*3/uL (ref 3.6–11.0)

## 2018-07-23 LAB — COMPREHENSIVE METABOLIC PANEL
ALBUMIN: 4.5 g/dL (ref 3.5–5.0)
ALT: 24 U/L (ref 0–44)
ANION GAP: 9 (ref 5–15)
AST: 19 U/L (ref 15–41)
Alkaline Phosphatase: 50 U/L (ref 38–126)
BUN: 16 mg/dL (ref 6–20)
CHLORIDE: 107 mmol/L (ref 98–111)
CO2: 24 mmol/L (ref 22–32)
Calcium: 9 mg/dL (ref 8.9–10.3)
Creatinine, Ser: 0.97 mg/dL (ref 0.44–1.00)
GFR calc non Af Amer: >60 mL/min (ref >60)
GLUCOSE: 108 mg/dL — AB (ref 70–99)
POTASSIUM: 4.1 mmol/L (ref 3.5–5.1)
SODIUM: 140 mmol/L (ref 135–145)
Total Bilirubin: 0.7 mg/dL (ref 0.3–1.2)
Total Protein: 7.7 g/dL (ref 6.5–8.1)

## 2018-07-23 LAB — URINALYSIS, COMPLETE (UACMP) WITH MICROSCOPIC
BACTERIA UA: NONE SEEN
BILIRUBIN URINE: NEGATIVE
Glucose, UA: NEGATIVE mg/dL
KETONES UR: NEGATIVE mg/dL
NITRITE: NEGATIVE
Protein, ur: NEGATIVE mg/dL
SPECIFIC GRAVITY, URINE: 1.016 (ref 1.005–1.030)
pH: 6 (ref 5.0–8.0)

## 2018-07-23 LAB — POC URINE PREG, ED: PREG TEST UR: NEGATIVE

## 2018-07-23 MED ORDER — KETOROLAC TROMETHAMINE 10 MG PO TABS: 10.0000 mg | ORAL_TABLET | Freq: Four times a day (QID) | ORAL | 0 refills | Status: DC | PRN

## 2018-07-23 MED ORDER — ONDANSETRON 4 MG PO TBDP: 4.0000 mg | ORAL_TABLET | Freq: Three times a day (TID) | ORAL | 0 refills | Status: DC | PRN

## 2018-07-23 MED ORDER — KETOROLAC TROMETHAMINE 30 MG/ML IJ SOLN
30.0000 mg | Freq: Once | INTRAMUSCULAR | Status: AC
Start: 2018-07-23 — End: 2018-07-23
  Administered 2018-07-23: 30 mg via INTRAVENOUS
  Filled 2018-07-23: qty 1

## 2018-07-23 MED ORDER — TAMSULOSIN HCL 0.4 MG PO CAPS
0.4000 mg | ORAL_CAPSULE | Freq: Every day | ORAL | 0 refills | Status: DC
Start: 2018-07-23 — End: 2020-02-08

## 2018-07-23 NOTE — ED Triage Notes (Signed)
Pt here with c/o left flank pain that began this am, states hx of kidney stones, no distress at this time.

## 2018-07-23 NOTE — ED Provider Notes (Signed)
[facility] Emergency Department Provider Note       Time seen: ----------------------------------------- 7:37 AM on 07/23/2018 -----------------------------------------   I have reviewed the triage vital signs and the nursing notes.  HISTORY   Chief Complaint Flank Pain    HPI Maureen Mcbride is a 27 y.o. female with a history of polycystic ovarian disease who presents to the ED for left flank pain that began this morning.  Patient states she has a history of kidney stones.  She was still able to go about her normal activities this morning but comes in for persistent pain.  She has not had any nausea or vomiting.  Past Medical History:  Diagnosis Date  . Hyperprolactinemia (HCC)   . PCOS (polycystic ovarian syndrome) 11/2016    Patient Active Problem List   Diagnosis Date Noted  . PCOS (polycystic ovarian syndrome) 05/17/2017  . Hyperprolactinemia (HCC) 05/17/2017    Past Surgical History:  Procedure Laterality Date  . APPENDECTOMY  2009   [facility]  . URETHRAL DILATION  1999   DR. KOWALSKI    Allergies Penicillins  Social History Social History   Tobacco Use  . Smoking status: Never Smoker  . Smokeless tobacco: Never Used  Substance Use Topics  . Alcohol use: No  . Drug use: No   Review of Systems Constitutional: Negative for fever. Cardiovascular: Negative for chest pain. Respiratory: Negative for shortness of breath. Gastrointestinal: Positive for flank pain Genitourinary: Negative for dysuria. Musculoskeletal: Negative for back pain. Skin: Negative for rash. Neurological: Negative for headaches, focal weakness or numbness.  All systems negative/normal/unremarkable except as stated in the HPI  ____________________________________________   PHYSICAL EXAM:  VITAL SIGNS: ED Triage Vitals  Enc Vitals Group     BP 07/23/18 0733 132/87     Pulse Rate 07/23/18 0733 62     Resp 07/23/18 0733 18     Temp 07/23/18 0733 97.7 F  (36.5 C)     Temp Source 07/23/18 0733 Oral     SpO2 07/23/18 0733 100 %     Weight 07/23/18 0734 170 lb (77.1 kg)     Height 07/23/18 0734 4\' 10"  (1.473 m)     Head Circumference --      Peak Flow --      Pain Score 07/23/18 0734 8     Pain Loc --      Pain Edu? --      Excl. in GC? --    Constitutional: Alert and oriented. Well appearing and in no distress. Eyes: Conjunctivae are normal. Normal extraocular movements. ENT   Head: Normocephalic and atraumatic.   Nose: No congestion/rhinnorhea.   Mouth/Throat: Mucous membranes are moist.   Neck: No stridor. Cardiovascular: Normal rate, regular rhythm. No murmurs, rubs, or gallops. Respiratory: Normal respiratory effort without tachypnea nor retractions. Breath sounds are clear and equal bilaterally. No wheezes/rales/rhonchi. Gastrointestinal: Soft and nontender. Normal bowel sounds Musculoskeletal: Nontender with normal range of motion in extremities. No lower extremity tenderness nor edema. Neurologic:  Normal speech and language. No gross focal neurologic deficits are appreciated.  Skin:  Skin is warm, dry and intact. No rash noted. Psychiatric: Mood and affect are normal. Speech and behavior are normal.  ____________________________________________  ED COURSE:  As part of my medical decision making, I reviewed the following data within the electronic MEDICAL RECORD NUMBER History obtained from family if available, nursing notes, old chart and ekg, as well as notes from prior ED visits. Patient presented for flank pain,  we will assess with labs and imaging as indicated at this time.   Procedures ____________________________________________   LABS (pertinent positives/negatives)  Labs Reviewed  COMPREHENSIVE METABOLIC PANEL - Abnormal; Notable for the following components:      Result Value   Glucose, Bld 108 (*)    All other components within normal limits  URINALYSIS, COMPLETE (UACMP) WITH MICROSCOPIC - Abnormal;  Notable for the following components:   Color, Urine YELLOW (*)    APPearance CLEAR (*)    Hgb urine dipstick LARGE (*)    Leukocytes, UA TRACE (*)    RBC / HPF >50 (*)    All other components within normal limits  CBC WITH DIFFERENTIAL/PLATELET  POC URINE PREG, ED    RADIOLOGY Images were viewed by me  KUB IMPRESSION: Normal exam. ____________________________________________  DIFFERENTIAL DIAGNOSIS   Renal colic, UTI, pyelonephritis, muscle strain  FINAL ASSESSMENT AND PLAN  Flank pain   Plan: The patient had presented for left flank pain. Patient's labs do indicate likely renal colic with extensive hematuria, she is not currently on her menses. Patient's imaging negative likely indicating a small kidney stone.  She will be discharged with Toradol, Flomax and Zofran.  She will be referred to urology for outpatient follow-up.   Ulice Dash, MD   Note: This note was generated in part or whole with voice recognition software. Voice recognition is usually quite accurate but there are transcription errors that can and very often do occur. I apologize for any typographical errors that were not detected and corrected.     Emily Filbert, MD 07/23/18 [phone]

## 2019-01-22 ENCOUNTER — Encounter: Payer: Self-pay | Admitting: Obstetrics and Gynecology

## 2019-01-22 ENCOUNTER — Ambulatory Visit (INDEPENDENT_AMBULATORY_CARE_PROVIDER_SITE_OTHER): Payer: Managed Care, Other (non HMO) | Admitting: Obstetrics and Gynecology

## 2019-01-22 ENCOUNTER — Other Ambulatory Visit (HOSPITAL_COMMUNITY)
Admission: RE | Admit: 2019-01-22 | Discharge: 2019-01-22 | Disposition: A | Discharge status: Home / Self Care | Payer: Managed Care, Other (non HMO) | Source: Ambulatory Visit | Attending: Obstetrics and Gynecology | Admitting: Obstetrics and Gynecology

## 2019-01-22 VITALS — BP 119/85 | HR 75 | Ht <= 58 in | Wt 176.0 lb

## 2019-01-22 DIAGNOSIS — Z23 Encounter for immunization: Secondary | ICD-10-CM | POA: Diagnosis not present

## 2019-01-22 DIAGNOSIS — E282 Polycystic ovarian syndrome: Secondary | ICD-10-CM

## 2019-01-22 DIAGNOSIS — Z01419 Encounter for gynecological examination (general) (routine) without abnormal findings: Secondary | ICD-10-CM

## 2019-01-22 MED ORDER — FLUOXETINE HCL 10 MG PO CAPS: 10.0000 mg | ORAL_CAPSULE | Freq: Every day | ORAL | 3 refills | Status: DC

## 2019-01-22 MED ORDER — TETANUS-DIPHTH-ACELL PERTUSSIS 5-2.5-18.5 LF-MCG/0.5 IM SUSP: 0.5000 mL | Freq: Once | INTRAMUSCULAR | Status: DC

## 2019-01-22 MED ORDER — SPIRONOLACTONE 50 MG PO TABS: 50.0000 mg | ORAL_TABLET | Freq: Two times a day (BID) | ORAL | 2 refills | Status: DC

## 2019-01-22 MED ORDER — TETANUS-DIPHTH-ACELL PERTUSSIS 5-2.5-18.5 LF-MCG/0.5 IM SUSP
0.5000 mL | Freq: Once | INTRAMUSCULAR | Status: AC
  Administered 2019-01-22: 0.5 mL via INTRAMUSCULAR

## 2019-01-22 NOTE — Patient Instructions (Addendum)
 Preventive Care 18-39 Years, Female Preventive care refers to lifestyle choices and visits with your health care provider that can promote health and wellness. What does preventive care include?   A yearly physical exam. This is also called an annual well check.  Dental exams once or twice a year.  Routine eye exams. Ask your health care provider how often you should have your eyes checked.  Personal lifestyle choices, including: ? Daily care of your teeth and gums. ? Regular physical activity. ? Eating a healthy diet. ? Avoiding tobacco and drug use. ? Limiting alcohol use. ? Practicing safe sex. ? Taking vitamin and mineral supplements as recommended by your health care provider. What happens during an annual well check? The services and screenings done by your health care provider during your annual well check will depend on your age, overall health, lifestyle risk factors, and family history of disease. Counseling Your health care provider may ask you questions about your:  Alcohol use.  Tobacco use.  Drug use.  Emotional well-being.  Home and relationship well-being.  Sexual activity.  Eating habits.  Work and work environment.  Method of birth control.  Menstrual cycle.  Pregnancy history. Screening You may have the following tests or measurements:  Height, weight, and BMI.  Diabetes screening. This is done by checking your blood sugar (glucose) after you have not eaten for a while (fasting).  Blood pressure.  Lipid and cholesterol levels. These may be checked every 5 years starting at age 20.  Skin check.  Hepatitis C blood test.  Hepatitis B blood test.  Sexually transmitted disease (STD) testing.  BRCA-related cancer screening. This may be done if you have a family history of breast, ovarian, tubal, or peritoneal cancers.  Pelvic exam and Pap test. This may be done every 3 years starting at age 21. Starting at age 30, this may be done  every 5 years if you have a Pap test in combination with an HPV test. Discuss your test results, treatment options, and if necessary, the need for more tests with your health care provider. Vaccines Your health care provider may recommend certain vaccines, such as:  Influenza vaccine. This is recommended every year.  Tetanus, diphtheria, and acellular pertussis (Tdap, Td) vaccine. You may need a Td booster every 10 years.  Varicella vaccine. You may need this if you have not been vaccinated.  HPV vaccine. If you are 26 or younger, you may need three doses over 6 months.  Measles, mumps, and rubella (MMR) vaccine. You may need at least one dose of MMR. You may also need a second dose.  Pneumococcal 13-valent conjugate (PCV13) vaccine. You may need this if you have certain conditions and were not previously vaccinated.  Pneumococcal polysaccharide (PPSV23) vaccine. You may need one or two doses if you smoke cigarettes or if you have certain conditions.  Meningococcal vaccine. One dose is recommended if you are age 19-21 years and a first-year college student living in a residence hall, or if you have one of several medical conditions. You may also need additional booster doses.  Hepatitis A vaccine. You may need this if you have certain conditions or if you travel or work in places where you may be exposed to hepatitis A.  Hepatitis B vaccine. You may need this if you have certain conditions or if you travel or work in places where you may be exposed to hepatitis B.  Haemophilus influenzae type b (Hib) vaccine. You may need this if   you have certain risk factors. Talk to your health care provider about which screenings and vaccines you need and how often you need them. This information is not intended to replace advice given to you by your health care provider. Make sure you discuss any questions you have with your health care provider. Document Released: 01/01/2002 Document Revised:  06/18/2017 Document Reviewed: 09/06/2015 Elsevier Interactive Patient Education  2019 [employer].  Diet for Polycystic Ovary Syndrome Polycystic ovary syndrome (PCOS) is a disorder of the chemicals (hormones) that regulate a woman's reproductive system, including monthly periods (menstruation). The condition causes important hormones to be out of balance. PCOS can:  Stop your periods or make them irregular.  Cause cysts to develop on your ovaries.  Make it difficult to get pregnant.  Stop your body from responding to the effects of insulin (insulin resistance). Insulin resistance can lead to obesity and diabetes. Changing what you eat can help you manage PCOS and improve your health. Following a balanced diet can help you lose weight and improve the way that your body uses insulin. What are tips for following this plan?  Follow a balanced diet for meals and snacks. Eat breakfast, lunch, dinner, and one or two snacks every day.  Include protein in each meal and snack.  Choose whole grains instead of products that are made with refined flour.  Eat a variety of foods.  Exercise regularly as told by your health care provider. Aim to do 30 or more minutes of exercise on most days of the week.  If you are overweight or obese: ? Pay attention to how many calories you eat. Cutting down on calories can help you lose weight. ? Work with your health care provider or a diet and nutrition specialist (dietitian) to figure out how many calories you need each day. What foods can I eat?  Fruits Include a variety of colors and types. All fruits are helpful for PCOS. Vegetables Include a variety of colors and types. All vegetables are helpful for PCOS. Grains Whole grains, such as whole wheat. Whole-grain breads, crackers, cereals, and pasta. Unsweetened oatmeal, bulgur, barley, quinoa, and brown rice. Tortillas made from corn or whole-wheat flour. Meats and other proteins Low-fat (lean)  proteins, such as fish, chicken, beans, eggs, and tofu. Dairy Low-fat dairy products, such as skim milk, cheese sticks, and yogurt. Beverages Low-fat or fat-free drinks, such as water, low-fat milk, sugar-free drinks, and small amounts of 100% fruit juice. Seasonings and condiments Ketchup. Mustard. Barbecue sauce. Relish. Low-fat or fat-free mayonnaise. Fats and oils Olive oil or canola oil. Walnuts and almonds. The items listed above may not be a complete list of recommended foods and beverages. Contact a dietitian for more options. What foods are not recommended? Foods that are high in calories or fat. Fried foods. Sweets. Products that are made from refined white flour, including white bread, pastries, white rice, and pasta. The items listed above may not be a complete list of foods and beverages to avoid. Contact a dietitian for more information. Summary  PCOS is a hormonal imbalance that affects a woman's reproductive system.  You can help to manage your PCOS by exercising regularly and eating a healthy, varied diet of vegetables, fruit, whole grains, low-fat (lean) protein, and low-fat dairy products.  Changing what you eat can improve the way that your body uses insulin, help your hormones reach normal levels, and help you lose weight. This information is not intended to replace advice given to you by your  health care provider. Make sure you discuss any questions you have with your health care provider. Document Released: 02/27/2016 Document Revised: 09/09/2017 Document Reviewed: 09/09/2017 Elsevier Interactive Patient Education  2019 [employer].  Generalized Anxiety Disorder, Adult Generalized anxiety disorder (GAD) is a mental health disorder. People with this condition constantly worry about everyday events. Unlike normal anxiety, worry related to GAD is not triggered by a specific event. These worries also do not fade or get better with time. GAD interferes with life  functions, including relationships, work, and school. GAD can vary from mild to severe. People with severe GAD can have intense waves of anxiety with physical symptoms (panic attacks). What are the causes? The exact cause of GAD is not known. What increases the risk? This condition is more likely to develop in:  Women.  People who have a family history of anxiety disorders.  People who are very shy.  People who experience very stressful life events, such as the death of a loved one.  People who have a very stressful family environment. What are the signs or symptoms? People with GAD often worry excessively about many things in their lives, such as their health and family. They may also be overly concerned about:  Doing well at work.  Being on time.  Natural disasters.  Friendships. Physical symptoms of GAD include:  Fatigue.  Muscle tension or having muscle twitches.  Trembling or feeling shaky.  Being easily startled.  Feeling like your heart is pounding or racing.  Feeling out of breath or like you cannot take a deep breath.  Having trouble falling asleep or staying asleep.  Sweating.  Nausea, diarrhea, or irritable bowel syndrome (IBS).  Headaches.  Trouble concentrating or remembering facts.  Restlessness.  Irritability. How is this diagnosed? Your health care provider can diagnose GAD based on your symptoms and medical history. You will also have a physical exam. The health care provider will ask specific questions about your symptoms, including how severe they are, when they started, and if they come and go. Your health care provider may ask you about your use of alcohol or drugs, including prescription medicines. Your health care provider may refer you to a mental health specialist for further evaluation. Your health care provider will do a thorough examination and may perform additional tests to rule out other possible causes of your symptoms. To be  diagnosed with GAD, a person must have anxiety that:  Is out of his or her control.  Affects several different aspects of his or her life, such as work and relationships.  Causes distress that makes him or her unable to take part in normal activities.  Includes at least three physical symptoms of GAD, such as restlessness, fatigue, trouble concentrating, irritability, muscle tension, or sleep problems. Before your health care provider can confirm a diagnosis of GAD, these symptoms must be present more days than they are not, and they must last for six months or longer. How is this treated? The following therapies are usually used to treat GAD:  Medicine. Antidepressant medicine is usually prescribed for long-term daily control. Antianxiety medicines may be added in severe cases, especially when panic attacks occur.  Talk therapy (psychotherapy). Certain types of talk therapy can be helpful in treating GAD by providing support, education, and guidance. Options include: ? Cognitive behavioral therapy (CBT). People learn coping skills and techniques to ease their anxiety. They learn to identify unrealistic or negative thoughts and behaviors and to replace them with positive ones. ?  Acceptance and commitment therapy (ACT). This treatment teaches people how to be mindful as a way to cope with unwanted thoughts and feelings. ? Biofeedback. This process trains you to manage your body's response (physiological response) through breathing techniques and relaxation methods. You will work with a therapist while machines are used to monitor your physical symptoms.  Stress management techniques. These include yoga, meditation, and exercise. A mental health specialist can help determine which treatment is best for you. Some people see improvement with one type of therapy. However, other people require a combination of therapies. Follow these instructions at home:  Take over-the-counter and prescription  medicines only as told by your health care provider.  Try to maintain a normal routine.  Try to anticipate stressful situations and allow extra time to manage them.  Practice any stress management or self-calming techniques as taught by your health care provider.  Do not punish yourself for setbacks or for not making progress.  Try to recognize your accomplishments, even if they are small.  Keep all follow-up visits as told by your health care provider. This is important. Contact a health care provider if:  Your symptoms do not get better.  Your symptoms get worse.  You have signs of depression, such as: ? A persistently sad, cranky, or irritable mood. ? Loss of enjoyment in activities that used to bring you joy. ? Change in weight or eating. ? Changes in sleeping habits. ? Avoiding friends or family members. ? Loss of energy for normal tasks. ? Feelings of guilt or worthlessness. Get help right away if:  You have serious thoughts about hurting yourself or others. If you ever feel like you may hurt yourself or others, or have thoughts about taking your own life, get help right away. You can go to your nearest emergency department or call:  Your local emergency services (911 in the [place]  A suicide crisis helpline, such as the [facility] at 276-353-6783. This is open 24 hours a day. Summary  Generalized anxiety disorder (GAD) is a mental health disorder that involves worry that is not triggered by a specific event.  People with GAD often worry excessively about many things in their lives, such as their health and family.  GAD may cause physical symptoms such as restlessness, trouble concentrating, sleep problems, frequent sweating, nausea, diarrhea, headaches, and trembling or muscle twitching.  A mental health specialist can help determine which treatment is best for you. Some people see improvement with one type of therapy. However, other  people require a combination of therapies. This information is not intended to replace advice given to you by your health care provider. Make sure you discuss any questions you have with your health care provider. Document Released: 03/02/2013 Document Revised: 09/25/2016 Document Reviewed: 09/25/2016 Elsevier Interactive Patient Education  2019 [employer]. Hirsutism and Masculinization Hirsutism is when a female has an excessive amount of hair in an area where a female would typically have hair growth, such as the face, chest, or back. Masculinization is when a female's body develops like a female's body. This may include a deepening voice, loss of breast tissue, increased muscle mass, thinning scalp hair (balding), and clitoris growth. Many things can cause these conditions. The most common cause is polycystic ovary syndrome (PCOS). Your health care provider may do blood tests and imaging studies to find the cause. Follow these instructions at home:  Take over-the-counter and prescription medicines only as told by your health care provider.  Lose  weight if you are overweight, and maintain a healthy weight.  Ask your health care provider for recommendations for hair removal. These may include temporary measures like shaving, creams, and waxing. More permanent ways to remove hair include electrolysis and laser treatments.  Keep all follow-up visits as told by your health care provider. This is important. Contact a health care provider if:  Your symptoms suddenly get worse.  You develop acne.  You have irregular menstrual periods.  You stop having your period.  You feel a lump in your lower abdomen. This information is not intended to replace advice given to you by your health care provider. Make sure you discuss any questions you have with your health care provider. Document Released: 01/14/2002 Document Revised: 04/12/2016 Document Reviewed: 07/26/2015 Elsevier Interactive Patient  Education  2019 [employer].
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
## 2019-01-26 ENCOUNTER — Other Ambulatory Visit: Payer: Managed Care, Other (non HMO)

## 2019-01-26 LAB — CYTOLOGY - PAP: DIAGNOSIS: NEGATIVE

## 2019-01-27 LAB — COMPREHENSIVE METABOLIC PANEL
ALBUMIN: 4.6 g/dL (ref 3.9–5.0)
ALK PHOS: 54 IU/L (ref 39–117)
ALT: 21 IU/L (ref 0–32)
AST: 17 IU/L (ref 0–40)
Albumin/Globulin Ratio: 1.9 (ref 1.2–2.2)
BUN / CREAT RATIO: 11 (ref 9–23)
BUN: 12 mg/dL (ref 6–20)
Bilirubin Total: 0.3 mg/dL (ref 0.0–1.2)
CO2: 18 mmol/L — AB (ref 20–29)
CREATININE: 1.06 mg/dL — AB (ref 0.57–1.00)
Calcium: 9.4 mg/dL (ref 8.7–10.2)
Chloride: 105 mmol/L (ref 96–106)
GFR calc non Af Amer: 73 mL/min/{1.73_m2} (ref >59)
GFR, EST AFRICAN AMERICAN: 84 mL/min/{1.73_m2} (ref >59)
GLOBULIN, TOTAL: 2.4 g/dL (ref 1.5–4.5)
GLUCOSE: 92 mg/dL (ref 65–99)
Potassium: 4.8 mmol/L (ref 3.5–5.2)
SODIUM: 145 mmol/L — AB (ref 134–144)
TOTAL PROTEIN: 7 g/dL (ref 6.0–8.5)

## 2019-01-27 LAB — CBC
Hematocrit: 41 % (ref 34.0–46.6)
Hemoglobin: 14.8 g/dL (ref 11.1–15.9)
MCH: 31 pg (ref 26.6–33.0)
MCHC: 36.1 g/dL — ABNORMAL HIGH (ref 31.5–35.7)
MCV: 86 fL (ref 79–97)
PLATELETS: 249 10*3/uL (ref 150–450)
RBC: 4.77 x10E6/uL (ref 3.77–5.28)
RDW: 12.6 % (ref 11.7–15.4)
WBC: 6.3 10*3/uL (ref 3.4–10.8)

## 2019-01-27 LAB — THYROID PANEL WITH TSH
Free Thyroxine Index: 2.1 (ref 1.2–4.9)
T3 UPTAKE RATIO: 25 % (ref 24–39)
T4 TOTAL: 8.4 ug/dL (ref 4.5–12.0)
TSH: 1.29 u[IU]/mL (ref 0.450–4.500)

## 2019-01-27 LAB — LIPID PANEL
CHOLESTEROL TOTAL: 185 mg/dL (ref 100–199)
Chol/HDL Ratio: 4.5 ratio — ABNORMAL HIGH (ref 0.0–4.4)
HDL: 41 mg/dL (ref >39)
LDL Calculated: 128 mg/dL — ABNORMAL HIGH (ref 0–99)
Triglycerides: 82 mg/dL (ref 0–149)
VLDL CHOLESTEROL CAL: 16 mg/dL (ref 5–40)

## 2019-01-27 LAB — FSH/LH
FSH: 5.1 m[IU]/mL
LH: 5.7 m[IU]/mL

## 2019-01-27 LAB — HEMOGLOBIN A1C
Est. average glucose Bld gHb Est-mCnc: 103 mg/dL
Hgb A1c MFr Bld: 5.2 % (ref 4.8–5.6)

## 2019-01-27 LAB — DHEA-SULFATE: DHEA SO4: 85.4 ug/dL (ref 84.8–378.0)

## 2019-01-27 LAB — TESTOSTERONE, FREE, TOTAL, SHBG
SEX HORMONE BINDING: 15.9 nmol/L — AB (ref 24.6–122.0)
TESTOSTERONE FREE: 3.1 pg/mL (ref 0.0–4.2)
TESTOSTERONE: 28 ng/dL (ref 8–48)

## 2019-01-27 LAB — PROGESTERONE: PROGESTERONE: 0.2 ng/mL

## 2019-01-27 LAB — PROLACTIN: Prolactin: 12.1 ng/mL (ref 4.8–23.3)

## 2019-01-27 LAB — INSULIN, RANDOM: INSULIN: 41 u[IU]/mL — AB (ref 2.6–24.9)

## 2019-01-30 ENCOUNTER — Other Ambulatory Visit: Payer: Self-pay | Admitting: Obstetrics and Gynecology

## 2019-01-30 MED ORDER — METFORMIN HCL 500 MG PO TABS: ORAL_TABLET | ORAL | 5 refills | Status: DC

## 2019-02-02 ENCOUNTER — Ambulatory Visit
Admission: RE | Admit: 2019-02-02 | Discharge: 2019-02-02 | Disposition: A | Discharge status: Home / Self Care | Payer: Managed Care, Other (non HMO) | Source: Ambulatory Visit | Attending: Obstetrics and Gynecology | Admitting: Obstetrics and Gynecology

## 2019-02-02 ENCOUNTER — Other Ambulatory Visit: Payer: Self-pay

## 2019-02-02 DIAGNOSIS — E282 Polycystic ovarian syndrome: Secondary | ICD-10-CM | POA: Diagnosis present

## 2019-02-25 ENCOUNTER — Ambulatory Visit (INDEPENDENT_AMBULATORY_CARE_PROVIDER_SITE_OTHER): Payer: Managed Care, Other (non HMO) | Admitting: Obstetrics and Gynecology

## 2019-02-25 ENCOUNTER — Other Ambulatory Visit: Payer: Self-pay

## 2019-02-25 ENCOUNTER — Encounter: Payer: Self-pay | Admitting: Obstetrics and Gynecology

## 2019-02-25 DIAGNOSIS — E282 Polycystic ovarian syndrome: Secondary | ICD-10-CM

## 2019-02-25 DIAGNOSIS — Z79899 Other long term (current) drug therapy: Secondary | ICD-10-CM

## 2019-02-25 DIAGNOSIS — Z1331 Encounter for screening for depression: Secondary | ICD-10-CM

## 2019-02-25 MED ORDER — DROSPIRENONE-ETHINYL ESTRADIOL 3-0.02 MG PO TABS: 1.0000 | ORAL_TABLET | Freq: Every day | ORAL | 11 refills | Status: DC

## 2019-02-25 NOTE — Progress Notes (Signed)
Transferred call from [facility] for televisit.Marland Kitchen2 identifiers. Pt is needing to review medications. PhQ9 done and was a zero. Call transferred to [place].

## 2019-02-25 NOTE — Progress Notes (Signed)
Virtual Visit via Telephone Note  I connected with Maureen Mcbride on 02/25/19 at  2:00 PM EDT by telephone and verified that I am speaking with the correct person using two identifiers.   I discussed the limitations, risks, security and privacy concerns of performing an evaluation and management service by telephone and the availability of in person appointments. I also discussed with the patient that there may be a patient responsible charge related to this service. The patient expressed understanding and agreed to proceed.   History of Present Illness: Seen previously to establish care and for evaluation of PCOS and depression. Please see previous note and lab/ultrasound results.   Observations/Objective:  States feeling tremendously better on the prozac. Desires to continue on it.  Depression screen [facility] 2/9 02/25/2019  Decreased Interest 0  Down, Depressed, Hopeless 0  PHQ - 2 Score 0  Altered sleeping 0  Tired, decreased energy 0  Change in appetite 0  Feeling bad or failure about yourself  0  Trouble concentrating 0  Moving slowly or fidgety/restless 0  Suicidal thoughts 0  PHQ-9 Score 0   States feeling fine on metformin, and is taking it one daily at this time. Is due to increase to 2 a day next week.  Has started spirinolactone and feels fine.   still desires trying for pregnancy in 6-12 months, and wants IUD removed in about 6 months.   Assessment and Plan: Medication management PCOS Elevated insulin Depression hirsutism   Follow Up Instructions: Will continue metformin, sprinolactone and prozac at current doses. Will add yaz now. To repeat labs in 3 months and have visit for medication management a week after lab draw.    I discussed the assessment and treatment plan with the patient. The patient was provided an opportunity to ask questions and all were answered. The patient agreed with the plan and demonstrated an understanding of the instructions.   The patient  was advised to call back or seek an in-person evaluation if the symptoms worsen or if the condition fails to improve as anticipated.  I provided 8 minutes of non-face-to-face time during this encounter.   Maureen Mcbride, CNM

## 2019-02-26 ENCOUNTER — Encounter: Payer: Managed Care, Other (non HMO) | Admitting: Obstetrics and Gynecology

## 2019-11-26 ENCOUNTER — Other Ambulatory Visit: Payer: Managed Care, Other (non HMO)

## 2020-02-05 NOTE — Progress Notes (Signed)
Pt present for annual exam. Pt stated that she was doing well.  Pt stated having irregular cycles and have PCOS. Pt currently has klyeena for birth control but would like to have it removed today. Pt and husband would like to conceive.

## 2020-02-05 NOTE — Patient Instructions (Addendum)
Preventive Care 21-27 Years Old, Female Preventive care refers to visits with your health care provider and lifestyle choices that can promote health and wellness. This includes:  A yearly physical exam. This may also be called an annual well check.  Regular dental visits and eye exams.  Immunizations.  Screening for certain conditions.  Healthy lifestyle choices, such as eating a healthy diet, getting regular exercise, not using drugs or products that contain nicotine and tobacco, and limiting alcohol use. What can I expect for my preventive care visit? Physical exam Your health care provider will check your:  Height and weight. This may be used to calculate body mass index (BMI), which tells if you are at a healthy weight.  Heart rate and blood pressure.  Skin for abnormal spots. Counseling Your health care provider may ask you questions about your:  Alcohol, tobacco, and drug use.  Emotional well-being.  Home and relationship well-being.  Sexual activity.  Eating habits.  Work and work environment.  Method of birth control.  Menstrual cycle.  Pregnancy history. What immunizations do I need?  Influenza (flu) vaccine  This is recommended every year. Tetanus, diphtheria, and pertussis (Tdap) vaccine  You may need a Td booster every 10 years. Varicella (chickenpox) vaccine  You may need this if you have not been vaccinated. Human papillomavirus (HPV) vaccine  If recommended by your health care provider, you may need three doses over 6 months. Measles, mumps, and rubella (MMR) vaccine  You may need at least one dose of MMR. You may also need a second dose. Meningococcal conjugate (MenACWY) vaccine  One dose is recommended if you are age 19-21 years and a first-year college student living in a residence hall, or if you have one of several medical conditions. You may also need additional booster doses. Pneumococcal conjugate (PCV13) vaccine  You may need  this if you have certain conditions and were not previously vaccinated. Pneumococcal polysaccharide (PPSV23) vaccine  You may need one or two doses if you smoke cigarettes or if you have certain conditions. Hepatitis A vaccine  You may need this if you have certain conditions or if you travel or work in places where you may be exposed to hepatitis A. Hepatitis B vaccine  You may need this if you have certain conditions or if you travel or work in places where you may be exposed to hepatitis B. Haemophilus influenzae type b (Hib) vaccine  You may need this if you have certain conditions. You may receive vaccines as individual doses or as more than one vaccine together in one shot (combination vaccines). Talk with your health care provider about the risks and benefits of combination vaccines. What tests do I need?  Blood tests  Lipid and cholesterol levels. These may be checked every 5 years starting at age 20.  Hepatitis C test.  Hepatitis B test. Screening  Diabetes screening. This is done by checking your blood sugar (glucose) after you have not eaten for a while (fasting).  Sexually transmitted disease (STD) testing.  BRCA-related cancer screening. This may be done if you have a family history of breast, ovarian, tubal, or peritoneal cancers.  Pelvic exam and Pap test. This may be done every 3 years starting at age 21. Starting at age 30, this may be done every 5 years if you have a Pap test in combination with an HPV test. Talk with your health care provider about your test results, treatment options, and if necessary, the need for more tests.   Follow these instructions at home: Eating and drinking   Eat a diet that includes fresh fruits and vegetables, whole grains, lean protein, and low-fat dairy.  Take vitamin and mineral supplements as recommended by your health care provider.  Do not drink alcohol if: ? Your health care provider tells you not to drink. ? You are  pregnant, may be pregnant, or are planning to become pregnant.  If you drink alcohol: ? Limit how much you have to 0-1 drink a day. ? Be aware of how much alcohol is in your drink. In the [place] one drink equals one 12 oz bottle of beer (355 mL), one 5 oz glass of wine (148 mL), or one 1 oz glass of hard liquor (44 mL). Lifestyle  Take daily care of your teeth and gums.  Stay active. Exercise for at least 30 minutes on 5 or more days each week.  Do not use any products that contain nicotine or tobacco, such as cigarettes, e-cigarettes, and chewing tobacco. If you need help quitting, ask your health care provider.  If you are sexually active, practice safe sex. Use a condom or other form of birth control (contraception) in order to prevent pregnancy and STIs (sexually transmitted infections). If you plan to become pregnant, see your health care provider for a preconception visit. What's next?  Visit your health care provider once a year for a well check visit.  Ask your health care provider how often you should have your eyes and teeth checked.  Stay up to date on all vaccines. This information is not intended to replace advice given to you by your health care provider. Make sure you discuss any questions you have with your health care provider. Document Revised: 07/17/2018 Document Reviewed: 07/17/2018 Elsevier Patient Education  2020 Else[employer]. Breast Self-Awareness Breast self-awareness is knowing how your breasts look and feel. Doing breast self-awareness is important. It allows you to catch a breast problem early while it is still small and can be treated. All women should do breast self-awareness, including women who have had breast implants. Tell your doctor if you notice a change in your breasts. What you need:  A mirror.  A well-lit room. How to do a breast self-exam A breast self-exam is one way to learn what is normal for your breasts and to check for changes. To do a  breast self-exam: Look for changes  1. Take off all the clothes above your waist. 2. Stand in front of a mirror in a room with good lighting. 3. Put your hands on your hips. 4. Push your hands down. 5. Look at your breasts and nipples in the mirror to see if one breast or nipple looks different from the other. Check to see if: ? The shape of one breast is different. ? The size of one breast is different. ? There are wrinkles, dips, and bumps in one breast and not the other. 6. Look at each breast for changes in the skin, such as: ? Redness. ? Scaly areas. 7. Look for changes in your nipples, such as: ? Liquid around the nipples. ? Bleeding. ? Dimpling. ? Redness. ? A change in where the nipples are. Feel for changes  1. Lie on your back on the floor. 2. Feel each breast. To do this, follow these steps: ? Pick a breast to feel. ? Put the arm closest to that breast above your head. ? Use your other arm to feel the nipple area of your breast. Feel   the area with the pads of your three middle fingers by making small circles with your fingers. For the first circle, press lightly. For the second circle, press harder. For the third circle, press even harder. ? Keep making circles with your fingers at the different pressures as you move down your breast. Stop when you feel your ribs. ? Move your fingers a little toward the center of your body. ? Start making circles with your fingers again, this time going up until you reach your collarbone. ? Keep making up-and-down circles until you reach your armpit. Remember to keep using the three pressures. ? Feel the other breast in the same way. 3. Sit or stand in the tub or shower. 4. With soapy water on your skin, feel each breast the same way you did in step 2 when you were lying on the floor. Write down what you find Writing down what you find can help you remember what to tell your doctor. Write down:  What is normal for each breast.  Any  changes you find in each breast, including: ? The kind of changes you find. ? Whether you have pain. ? Size and location of any lumps.  When you last had your menstrual period. General tips  Check your breasts every month.  If you are breastfeeding, the best time to check your breasts is after you feed your baby or after you use a breast pump.  If you get menstrual periods, the best time to check your breasts is 5-7 days after your menstrual period is over.  With time, you will become comfortable with the self-exam, and you will begin to know if there are changes in your breasts. Contact a doctor if you:  See a change in the shape or size of your breasts or nipples.  See a change in the skin of your breast or nipples, such as red or scaly skin.  Have fluid coming from your nipples that is not normal.  Find a lump or thick area that was not there before.  Have pain in your breasts.  Have any concerns about your breast health. Summary  Breast self-awareness includes looking for changes in your breasts, as well as feeling for changes within your breasts.  Breast self-awareness should be done in front of a mirror in a well-lit room.  You should check your breasts every month. If you get menstrual periods, the best time to check your breasts is 5-7 days after your menstrual period is over.  Let your doctor know of any changes you see in your breasts, including changes in size, changes on the skin, pain or tenderness, or fluid from your nipples that is not normal. This information is not intended to replace advice given to you by your health care provider. Make sure you discuss any questions you have with your health care provider. Document Revised: 06/24/2018 Document Reviewed: 06/24/2018 Elsevier Patient Education  [place]. Metformin tablets What is this medicine? METFORMIN (met FOR min) is used to treat type 2 diabetes. It helps to control blood sugar. Treatment is  combined with diet and exercise. This medicine can be used alone or with other medicines for diabetes. This medicine may be used for other purposes; ask your health care provider or pharmacist if you have questions. COMMON BRAND NAME(S): Glucophage What should I tell my health care provider before I take this medicine? They need to know if you have any of these conditions:  anemia  dehydration  heart  disease  frequently drink alcohol-containing beverages  kidney disease  liver disease  polycystic ovary syndrome  serious infection or injury  vomiting  an unusual or allergic reaction to metformin, other medicines, foods, dyes, or preservatives  pregnant or trying to get pregnant  breast-feeding How should I use this medicine? Take this medicine by mouth with a glass of water. Follow the directions on the prescription label. Take this medicine with food. Take your medicine at regular intervals. Do not take your medicine more often than directed. Do not stop taking except on your doctor's advice. Talk to your pediatrician regarding the use of this medicine in children. While this drug may be prescribed for children as young as 36 years of age for selected conditions, precautions do apply. Overdosage: If you think you have taken too much of this medicine contact a poison control center or emergency room at once. NOTE: This medicine is only for you. Do not share this medicine with others. What if I miss a dose? If you miss a dose, take it as soon as you can. If it is almost time for your next dose, take only that dose. Do not take double or extra doses. What may interact with this medicine? Do not take this medicine with any of the following medications:  certain contrast medicines given before X-rays, CT scans, MRI, or other procedures  dofetilide This medicine may also interact with the following medications:  acetazolamide  alcohol  certain antivirals for HIV or  hepatitis  certain medicines for blood pressure, heart disease, irregular heart beat  cimetidine  dichlorphenamide  digoxin  diuretics  female hormones, like estrogens or progestins and birth control pills  glycopyrrolate  isoniazid  lamotrigine  memantine  methazolamide  metoclopramide  midodrine  niacin  phenothiazines like chlorpromazine, mesoridazine, prochlorperazine, thioridazine  phenytoin  ranolazine  steroid medicines like prednisone or cortisone  stimulant medicines for attention disorders, weight loss, or to stay awake  thyroid medicines  topiramate  trospium  vandetanib  zonisamide This list may not describe all possible interactions. Give your health care provider a list of all the medicines, herbs, non-prescription drugs, or dietary supplements you use. Also tell them if you smoke, drink alcohol, or use illegal drugs. Some items may interact with your medicine. What should I watch for while using this medicine? Visit your doctor or health care professional for regular checks on your progress. A test called the HbA1C (A1C) will be monitored. This is a simple blood test. It measures your blood sugar control over the last 2 to 3 months. You will receive this test every 3 to 6 months. Learn how to check your blood sugar. Learn the symptoms of low and high blood sugar and how to manage them. Always carry a quick-source of sugar with you in case you have symptoms of low blood sugar. Examples include hard sugar candy or glucose tablets. Make sure others know that you can choke if you eat or drink when you develop serious symptoms of low blood sugar, such as seizures or unconsciousness. They must get medical help at once. Tell your doctor or health care professional if you have high blood sugar. You might need to change the dose of your medicine. If you are sick or exercising more than usual, you might need to change the dose of your medicine. Do not skip  meals. Ask your doctor or health care professional if you should avoid alcohol. Many nonprescription cough and cold products contain sugar or alcohol.  These can affect blood sugar. This medicine may cause ovulation in premenopausal women who do not have regular monthly periods. This may increase your chances of becoming pregnant. You should not take this medicine if you become pregnant or think you may be pregnant. Talk with your doctor or health care professional about your birth control options while taking this medicine. Contact your doctor or health care professional right away if you think you are pregnant. If you are going to need surgery, a MRI, CT scan, or other procedure, tell your doctor that you are taking this medicine. You may need to stop taking this medicine before the procedure. Wear a medical ID bracelet or chain, and carry a card that describes your disease and details of your medicine and dosage times. This medicine may cause a decrease in folic acid and vitamin B12. You should make sure that you get enough vitamins while you are taking this medicine. Discuss the foods you eat and the vitamins you take with your health care professional. What side effects may I notice from receiving this medicine? Side effects that you should report to your doctor or health care professional as soon as possible:  allergic reactions like skin rash, itching or hives, swelling of the face, lips, or tongue  breathing problems  feeling faint or lightheaded, falls  muscle aches or pains  signs and symptoms of low blood sugar such as feeling anxious, confusion, dizziness, increased hunger, unusually weak or tired, sweating, shakiness, cold, irritable, headache, blurred vision, fast heartbeat, loss of consciousness  slow or irregular heartbeat  unusual stomach pain or discomfort  unusually tired or weak Side effects that usually do not require medical attention (report to your doctor or health care  professional if they continue or are bothersome):  diarrhea  headache  heartburn  metallic taste in mouth  nausea  stomach gas, upset This list may not describe all possible side effects. Call your doctor for medical advice about side effects. You may report side effects to FDA at 1-800-FDA-1088. Where should I keep my medicine? Keep out of the reach of children. Store at room temperature between 15 and 30 degrees C (59 and 86 degrees F). Protect from moisture and light. Throw away any unused medicine after the expiration date. NOTE: This sheet is a summary. It may not cover all possible information. If you have questions about this medicine, talk to your doctor, pharmacist, or health care provider.  2020 Elsevier/Gold Standard (2017-12-12 19:15:19)  Diet for Polycystic Ovary Syndrome Polycystic ovary syndrome (PCOS) is a disorder of the chemicals (hormones) that regulate a woman's reproductive system, including monthly periods (menstruation). The condition causes important hormones to be out of balance. PCOS can:  Stop your periods or make them irregular.  Cause cysts to develop on your ovaries.  Make it difficult to get pregnant.  Stop your body from responding to the effects of insulin (insulin resistance). Insulin resistance can lead to obesity and diabetes. Changing what you eat can help you manage PCOS and improve your health. Following a balanced diet can help you lose weight and improve the way that your body uses insulin. What are tips for following this plan?  Follow a balanced diet for meals and snacks. Eat breakfast, lunch, dinner, and one or two snacks every day.  Include protein in each meal and snack.  Choose whole grains instead of products that are made with refined flour.  Eat a variety of foods.  Exercise regularly as told by your  health care provider. Aim to do 30 or more minutes of exercise on most days of the week.  If you are overweight or  obese: ? Pay attention to how many calories you eat. Cutting down on calories can help you lose weight. ? Work with your health care provider or a diet and nutrition specialist (dietitian) to figure out how many calories you need each day. What foods can I eat?  Fruits Include a variety of colors and types. All fruits are helpful for PCOS. Vegetables Include a variety of colors and types. All vegetables are helpful for PCOS. Grains Whole grains, such as whole wheat. Whole-grain breads, crackers, cereals, and pasta. Unsweetened oatmeal, bulgur, barley, quinoa, and brown rice. Tortillas made from corn or whole-wheat flour. Meats and other proteins Low-fat (lean) proteins, such as fish, chicken, beans, eggs, and tofu. Dairy Low-fat dairy products, such as skim milk, cheese sticks, and yogurt. Beverages Low-fat or fat-free drinks, such as water, low-fat milk, sugar-free drinks, and small amounts of 100% fruit juice. Seasonings and condiments Ketchup. Mustard. Barbecue sauce. Relish. Low-fat or fat-free mayonnaise. Fats and oils Olive oil or canola oil. Walnuts and almonds. The items listed above may not be a complete list of recommended foods and beverages. Contact a dietitian for more options. What foods are not recommended? Foods that are high in calories or fat. Fried foods. Sweets. Products that are made from refined white flour, including white bread, pastries, white rice, and pasta. The items listed above may not be a complete list of foods and beverages to avoid. Contact a dietitian for more information. Summary  PCOS is a hormonal imbalance that affects a woman's reproductive system.  You can help to manage your PCOS by exercising regularly and eating a healthy, varied diet of vegetables, fruit, whole grains, low-fat (lean) protein, and low-fat dairy products.  Changing what you eat can improve the way that your body uses insulin, help your hormones reach normal levels, and help  you lose weight. This information is not intended to replace advice given to you by your health care provider. Make sure you discuss any questions you have with your health care provider. Document Revised: 02/25/2019 Document Reviewed: 09/09/2017 Elsevier Patient Education  2020 [employer].  Preparing for Pregnancy If you are considering becoming pregnant, make an appointment to see your regular health care provider to learn how to prepare for a safe and healthy pregnancy (preconception care). During a preconception care visit, your health care provider will: Do a complete physical exam, including a Pap test. Take a complete medical history. Give you information, answer your questions, and help you resolve problems. Preconception checklist Medical history Tell your health care provider about any current or past medical conditions. Your pregnancy or your ability to become pregnant may be affected by chronic conditions, such as diabetes, chronic hypertension, and thyroid problems. Include your family's medical history as well as your partner's medical history. Tell your health care provider about any history of STIs (sexually transmitted infections).These can affect your pregnancy. In some cases, they can be passed to your baby. Discuss any concerns that you have about STIs. If indicated, discuss the benefits of genetic testing. This testing will show whether there are any genetic conditions that may be passed from you or your partner to your baby. Tell your health care provider about: Any problems you have had with conception or pregnancy. Any medicines you take. These include vitamins, herbal supplements, and over-the-counter medicines. Your history of immunizations. Discuss any  vaccinations that you may need. Diet Ask your health care provider what to include in a healthy diet that has a balance of nutrients. This is especially important when you are pregnant or preparing to become  pregnant. Ask your health care provider to help you reach a healthy weight before pregnancy. If you are overweight, you may be at higher risk for certain complications, such as high blood pressure, diabetes, and preterm birth. If you are underweight, you are more likely to have a baby who has a low birth weight. Lifestyle, work, and home Let your health care provider know: About any lifestyle habits that you have, such as alcohol use, drug use, or smoking. About recreational activities that may put you at risk during pregnancy, such as downhill skiing and certain exercise programs. Tell your health care provider about any international travel, especially any travel to places with an active [place] virus outbreak. About harmful substances that you may be exposed to at work or at home. These include chemicals, pesticides, radiation, or even litter boxes. If you do not feel safe at home. Mental health Tell your health care provider about: Any history of mental health conditions, including feelings of depression, sadness, or anxiety. Any medicines that you take for a mental health condition. These include herbs and supplements. Home instructions to prepare for pregnancy Lifestyle  Eat a balanced diet. This includes fresh fruits and vegetables, whole grains, lean meats, low-fat dairy products, healthy fats, and foods that are high in fiber. Ask to meet with a nutritionist or registered dietitian for assistance with meal planning and goals. Get regular exercise. Try to be active for at least 30 minutes a day on most days of the week. Ask your health care provider which activities are safe during pregnancy. Do not use any products that contain nicotine or tobacco, such as cigarettes and e-cigarettes. If you need help quitting, ask your health care provider. Do not drink alcohol. Do not take illegal drugs. Maintain a healthy weight. Ask your health care provider what weight range is right for  you. General instructions Keep an accurate record of your menstrual periods. This makes it easier for your health care provider to determine your baby's due date. Begin taking prenatal vitamins and folic acid supplements daily as directed by your health care provider. Manage any chronic conditions, such as high blood pressure and diabetes, as told by your health care provider. This is important. How do I know that I am pregnant? You may be pregnant if you have been sexually active and you miss your period. Symptoms of early pregnancy include: Mild cramping. Very light vaginal bleeding (spotting). Feeling unusually tired. Nausea and vomiting (morning sickness). If you have any of these symptoms and you suspect that you might be pregnant, you can take a home pregnancy test. These tests check for a hormone in your urine (human chorionic gonadotropin, or hCG). A woman's body begins to make this hormone during early pregnancy. These tests are very accurate. Wait until at least the first day after you miss your period to take one. If the test shows that you are pregnant (you get a positive result), call your health care provider to make an appointment for prenatal care. What should I do if I become pregnant?     Make an appointment with your health care provider as soon as you suspect you are pregnant. Do not use any products that contain nicotine, such as cigarettes, chewing tobacco, and e-cigarettes. If you need help quitting,  ask your health care provider. Do not drink alcoholic beverages. Alcohol is related to a number of birth defects. Avoid toxic odors and chemicals. You may continue to have sexual intercourse if it does not cause pain or other problems, such as vaginal bleeding. This information is not intended to replace advice given to you by your health care provider. Make sure you discuss any questions you have with your health care provider. Document Revised: 11/07/2017 Document Reviewed:  05/27/2016 Elsevier Patient Education  [place].

## 2020-02-08 ENCOUNTER — Ambulatory Visit (INDEPENDENT_AMBULATORY_CARE_PROVIDER_SITE_OTHER): Payer: Managed Care, Other (non HMO) | Admitting: Certified Nurse Midwife

## 2020-02-08 ENCOUNTER — Other Ambulatory Visit: Payer: Self-pay

## 2020-02-08 ENCOUNTER — Encounter: Payer: Self-pay | Admitting: Certified Nurse Midwife

## 2020-02-08 VITALS — BP 132/86 | HR 63 | Ht <= 58 in | Wt 183.5 lb

## 2020-02-08 DIAGNOSIS — Z30432 Encounter for removal of intrauterine contraceptive device: Secondary | ICD-10-CM

## 2020-02-08 DIAGNOSIS — Z01419 Encounter for gynecological examination (general) (routine) without abnormal findings: Secondary | ICD-10-CM | POA: Diagnosis not present

## 2020-02-08 DIAGNOSIS — Z319 Encounter for procreative management, unspecified: Secondary | ICD-10-CM

## 2020-02-08 MED ORDER — METFORMIN HCL 500 MG PO TABS: ORAL_TABLET | ORAL | 5 refills | Status: AC

## 2020-02-08 NOTE — Progress Notes (Signed)
ANNUAL PREVENTATIVE CARE GYN  ENCOUNTER NOTE  Subjective:       Maureen Mcbride is a 29 y.o. G0P0000 female here for a routine annual gynecologic exam.  Current complaints: 1. Desire pregnancy, requests IUD removal.   Denies difficulty breathing or respiratory distress, chest pain, abdominal pain, excessive vaginal bleeding, dysuria, and leg pain or swelling.    Gynecologic History  No LMP recorded. (Menstrual status: IUD).  Contraception: IUD, Rutha Bouchard  Last Pap: 01/2019. Results were: normal  Obstetric History  OB History  Gravida Para Term Preterm AB Living  0 0 0 0 0 0  SAB TAB Ectopic Multiple Live Births  0 0 0 0 0    Past Medical History:  Diagnosis Date  . Hyperprolactinemia (HCC)   . Mental disorder   . PCOS (polycystic ovarian syndrome) 11/2016    Past Surgical History:  Procedure Laterality Date  . APPENDECTOMY  2009   [facility]  . URETHRAL DILATION  1999   DR. KOWALSKI    Current Outpatient Medications on File Prior to Visit  Medication Sig Dispense Refill  . Levonorgestrel (KYLEENA) 19.5 MG IUD 1 Device by Intrauterine route once for 1 dose. 1 Intra Uterine Device 0   No current facility-administered medications on file prior to visit.    Allergies  Allergen Reactions  . Penicillins Hives    Has patient had a PCN reaction causing immediate rash, facial/tongue/throat swelling, SOB or lightheadedness with hypotension: Yes Has patient had a PCN reaction causing severe rash involving mucus membranes or skin necrosis: No Has patient had a PCN reaction that required hospitalization: No Has patient had a PCN reaction occurring within the last 10 years: No If all of the above answers are "NO", then may proceed with Cephalosporin use.    Social History   Socioeconomic History  . Marital status: Married    Spouse name: Not on file  . Number of children: 0  . Years of education: 59  . Highest education level: Not on file  Occupational History  . Not on  file  Tobacco Use  . Smoking status: Never Smoker  . Smokeless tobacco: Never Used  Substance and Sexual Activity  . Alcohol use: Yes    Comment: socially  . Drug use: No  . Sexual activity: Yes    Birth control/protection: I.U.D.  Other Topics Concern  . Not on file  Social History Narrative  . Not on file   Social Determinants of Health   Financial Resource Strain:   . Difficulty of Paying Living Expenses:   Food Insecurity:   . Worried About Programme researcher, broadcasting/film/video in the Last Year:   . Barista in the Last Year:   Transportation Needs:   . Freight forwarder (Medical):   Marland Kitchen Lack of Transportation (Non-Medical):   Physical Activity:   . Days of Exercise per Week:   . Minutes of Exercise per Session:   Stress:   . Feeling of Stress :   Social Connections:   . Frequency of Communication with Friends and Family:   . Frequency of Social Gatherings with Friends and Family:   . Attends Religious Services:   . Active Member of Clubs or Organizations:   . Attends Banker Meetings:   Marland Kitchen Marital Status:   Intimate Partner Violence:   . Fear of Current or Ex-Partner:   . Emotionally Abused:   Marland Kitchen Physically Abused:   . Sexually Abused:     Family History  Problem Relation Age of Onset  . Colon cancer Maternal Grandfather        16  . Diabetes Mellitus II Paternal Grandmother   . Uterine cancer Maternal Grandmother   . Diabetes Mellitus II Father   . Hypertension Mother   . Bipolar disorder Brother   . Polycystic ovary syndrome Sister     The following portions of the patient's history were reviewed and updated as appropriate: allergies, current medications, past family history, past medical history, past social history, past surgical history and problem list.  Review of Systems  ROS negative except as noted above. Information obtained from patient.    Objective:   BP 132/86   Pulse 63   Ht 4\' 10"  (1.473 m)   Wt 183 lb 8 oz (83.2 kg)   BMI  38.35 kg/m    CONSTITUTIONAL: Well-developed, well-nourished female in no acute distress.   PSYCHIATRIC: Normal mood and affect. Normal behavior. Normal judgment and thought content.  [place]: Alert and oriented to person, place, and time. Normal muscle tone coordination. No cranial nerve deficit noted.  HENT:  Normocephalic, atraumatic, External right and left ear normal.   EYES: Conjunctivae and EOM are normal. Pupils are equal and round.   NECK: Normal range of motion, supple, no masses.  Normal thyroid.   SKIN: Skin is warm and dry. No rash noted. Not diaphoretic. No erythema. No pallor. Professional tattoos present.   CARDIOVASCULAR: Normal heart rate noted, regular rhythm, no murmur.  RESPIRATORY: Clear to auscultation bilaterally. Effort and breath sounds normal, no problems with respiration noted.  BREASTS: Symmetric in size. No masses, skin changes, nipple drainage, or lymphadenopathy.  ABDOMEN: Soft, normal bowel sounds, no distention noted.  No tenderness, rebound or guarding.   PELVIC:  External Genitalia: Normal  Vagina: Normal  Cervix: Normal, IUD strings present  Uterus: Normal  Adnexa: Normal    MUSCULOSKELETAL: Normal range of motion. No tenderness.  No cyanosis, clubbing, or edema.  2+ distal pulses.  LYMPHATIC: No Axillary, Supraclavicular, or Inguinal Adenopathy.  Assessment:   Annual gynecologic examination 29 y.o.   Contraception: none, desires pregnancy; IUD removed   Obesity 2   Problem List Items Addressed This Visit    None    Visit Diagnoses    Encounter for well woman exam with routine gynecological exam    -  Primary   Encounter for IUD removal       Patient desires pregnancy          Plan:   Pap: Not needed  Labs: Declined   Rx Metformin, see orders  Samples of prenatal vitamins provided  Routine preventative health maintenance measures emphasized: Exercise/Diet/Weight control, Tobacco Warnings, Alcohol/Substance use  risks and Stress Management; see AVS  Reviewed red flag symptoms and when to call  Return to [place], CNM [facility], [facility] 02/08/20 4:30 PM   IUD Removal Note:  Maureen Mcbride is a 29 y.o. year old G47P0000 Caucasian female who presents for removal of a [place] IUD. Her IUD was placed 12/2017. Patient and spouse desire pregnancy.   BP 132/86   Pulse 63   Ht 4\' 10"  (1.473 m)   Wt 183 lb 8 oz (83.2 kg)   BMI 38.35 kg/m   Time out was performed.  A small plastic speculum was placed in the vagina.  The cervix was visualized, and the strings were visible. They were grasped and the Mirena was easily removed intact without  complications.   RTC as needed.    Gunnar Bulla, CNM [facility], [facility] 02/08/20 4:30 PM

## 2020-02-19 ENCOUNTER — Encounter: Payer: Self-pay | Admitting: Emergency Medicine

## 2020-02-19 ENCOUNTER — Other Ambulatory Visit: Payer: Self-pay

## 2020-02-19 ENCOUNTER — Ambulatory Visit
Admission: EM | Admit: 2020-02-19 | Discharge: 2020-02-19 | Disposition: A | Discharge status: Home / Self Care | Payer: Managed Care, Other (non HMO) | Attending: Family Medicine | Admitting: Family Medicine

## 2020-02-19 DIAGNOSIS — G43009 Migraine without aura, not intractable, without status migrainosus: Secondary | ICD-10-CM | POA: Diagnosis not present

## 2020-02-19 MED ORDER — CYCLOBENZAPRINE HCL 10 MG PO TABS: 10.0000 mg | ORAL_TABLET | Freq: Every day | ORAL | 0 refills | Status: DC

## 2020-02-19 MED ORDER — HYDROCODONE-ACETAMINOPHEN 5-325 MG PO TABS: ORAL_TABLET | ORAL | 0 refills | Status: DC

## 2020-02-19 MED ORDER — ONDANSETRON 8 MG PO TBDP: 8.0000 mg | ORAL_TABLET | Freq: Three times a day (TID) | ORAL | 0 refills | Status: DC | PRN

## 2020-02-19 NOTE — ED Triage Notes (Signed)
Patient in today c/o headache x 8 days worsening over the last 2 days. Patient has had nausea, sensitivity to light. Patient has taken OTC Tylenol, Ibuprofen, Excedrin Migraine and BC Powders without relief.

## 2020-02-21 NOTE — ED Provider Notes (Signed)
[facility]    CSN: [id number] Arrival date & time: 02/19/20  6283      History   Chief Complaint Chief Complaint  Patient presents with  . Headache    HPI Maureen Mcbride is a 29 y.o. female.   29 yo female with a c/o migraine headache for the past week. States headache is associated with nausea and photophobia. States normally otc meds will treat her migraine, however this past week those medications have not improved her symptoms. Denies any vomiting, fevers, chills, numbness/tingling, unilateral weakness, vision changes.    Headache   Past Medical History:  Diagnosis Date  . Hyperprolactinemia (HCC)   . Mental disorder   . PCOS (polycystic ovarian syndrome) 11/2016    Patient Active Problem List   Diagnosis Date Noted  . PCOS (polycystic ovarian syndrome) 05/17/2017    Past Surgical History:  Procedure Laterality Date  . APPENDECTOMY  2009   [facility]  . URETHRAL DILATION  1999   DR. KOWALSKI    OB History    Gravida  0   Para  0   Term  0   Preterm  0   AB  0   Living  0     SAB  0   TAB  0   Ectopic  0   Multiple  0   Live Births  0            Home Medications    Prior to Admission medications   Medication Sig Start Date End Date Taking? Authorizing Provider  metFORMIN (GLUCOPHAGE) 500 MG tablet Take one tablet by mouth daily for one week. Then increase to one tablet twice a day for one week.  Then two tablets twice a day. 02/08/20  Yes Lawhorn, Vanessa , CNM  cyclobenzaprine (FLEXERIL) 10 MG tablet Take 1 tablet (10 mg total) by mouth at bedtime. 02/19/20   Payton Mccallum, MD  HYDROcodone-acetaminophen (NORCO/VICODIN) 5-325 MG tablet 1-2 tabs po q 8 hours prn 02/19/20   Payton Mccallum, MD  Levonorgestrel [facility]) 19.5 MG IUD 1 Device by Intrauterine route once for 1 dose. 12/27/17 02/08/20  Conard Novak, MD  ondansetron (ZOFRAN ODT) 8 MG disintegrating tablet Take 1 tablet (8 mg total) by mouth every 8 (eight) hours  as needed. 02/19/20   Payton Mccallum, MD    Family History Family History  Problem Relation Age of Onset  . Colon cancer Maternal Grandfather        60  . Diabetes Mellitus II Paternal Grandmother   . Uterine cancer Maternal Grandmother   . Migraines Maternal Grandmother   . Diabetes Mellitus II Father   . Hypertension Mother   . Migraines Mother   . Bipolar disorder Brother   . Polycystic ovary syndrome Sister     Social History Social History   Tobacco Use  . Smoking status: Never Smoker  . Smokeless tobacco: Never Used  Substance Use Topics  . Alcohol use: Yes    Comment: socially  . Drug use: No     Allergies   Penicillins   Review of Systems Review of Systems  Neurological: Positive for headaches.     Physical Exam Triage Vital Signs ED Triage Vitals  Enc Vitals Group     BP 02/19/20 0826 (!) 142/90     Pulse Rate 02/19/20 0826 82     Resp 02/19/20 0826 18     Temp 02/19/20 0826 98.1 F (36.7 C)     Temp Source  02/19/20 0826 Oral     SpO2 02/19/20 0826 100 %     Weight 02/19/20 0826 180 lb (81.6 kg)     Height 02/19/20 0826 4\' 10"  (1.473 m)     Head Circumference --      Peak Flow --      Pain Score 02/19/20 0825 8     Pain Loc --      Pain Edu? --      Excl. in [place]? --    No data found.  Updated Vital Signs BP (!) 142/90 (BP Location: Left Arm)   Pulse 82   Temp 98.1 F (36.7 C) (Oral)   Resp 18   Ht 4\' 10"  (1.473 m)   Wt 81.6 kg   SpO2 100%   BMI 37.62 kg/m   Visual Acuity Right Eye Distance:   Left Eye Distance:   Bilateral Distance:    Right Eye Near:   Left Eye Near:    Bilateral Near:     Physical Exam Vitals reviewed.  Constitutional:      General: She is not in acute distress.    Appearance: She is not toxic-appearing or diaphoretic.  Eyes:     Extraocular Movements: Extraocular movements intact.  Cardiovascular:     Rate and Rhythm: Normal rate.     Heart sounds: Normal heart sounds.  Pulmonary:     Effort:  Pulmonary effort is normal. No respiratory distress.  Musculoskeletal:     Cervical back: Normal range of motion and neck supple.  Neurological:     General: No focal deficit present.     Mental Status: She is alert and oriented to person, place, and time.     Cranial Nerves: No cranial nerve deficit.     Sensory: No sensory deficit.      UC Treatments / Results  Labs (all labs ordered are listed, but only abnormal results are displayed) Labs Reviewed - No data to display  EKG   Radiology No results found.  Procedures Procedures (including critical care time)  Medications Ordered in UC Medications - No data to display  Initial Impression / Assessment and Plan / UC Course  I have reviewed the triage vital signs and the nursing notes.  Pertinent labs & imaging results that were available during my care of the patient were reviewed by me and considered in my medical decision making (see chart for details).      Final Clinical Impressions(s) / UC Diagnoses   Final diagnoses:  Migraine without aura and without status migrainosus, not intractable    ED Prescriptions    Medication Sig Dispense Auth. Provider   cyclobenzaprine (FLEXERIL) 10 MG tablet Take 1 tablet (10 mg total) by mouth at bedtime. 10 tablet Norval Gable, MD   ondansetron (ZOFRAN ODT) 8 MG disintegrating tablet Take 1 tablet (8 mg total) by mouth every 8 (eight) hours as needed. 6 tablet Norval Gable, MD   HYDROcodone-acetaminophen (NORCO/VICODIN) 5-325 MG tablet 1-2 tabs po q 8 hours prn 6 tablet Maureen Sovine, MD      1. diagnosis reviewed with patient 2. rx as per orders above; reviewed possible side effects, interactions, risks and benefits  3. Follow-up prn if symptoms worsen or don't improve   I have reviewed the PDMP during this encounter.   Norval Gable, MD 02/21/20 [phone]

## 2020-02-29 ENCOUNTER — Emergency Department
Admission: EM | Admit: 2020-02-29 | Discharge: 2020-03-01 | Disposition: A | Discharge status: Home / Self Care | Payer: No Typology Code available for payment source | Attending: Student in an Organized Health Care Education/Training Program | Admitting: Student in an Organized Health Care Education/Training Program

## 2020-02-29 ENCOUNTER — Encounter: Payer: Self-pay | Admitting: Emergency Medicine

## 2020-02-29 ENCOUNTER — Other Ambulatory Visit: Payer: Self-pay

## 2020-02-29 DIAGNOSIS — W540XXA Bitten by dog, initial encounter: Secondary | ICD-10-CM | POA: Insufficient documentation

## 2020-02-29 DIAGNOSIS — Z203 Contact with and (suspected) exposure to rabies: Secondary | ICD-10-CM | POA: Insufficient documentation

## 2020-02-29 DIAGNOSIS — S01452A Open bite of left cheek and temporomandibular area, initial encounter: Secondary | ICD-10-CM | POA: Diagnosis not present

## 2020-02-29 DIAGNOSIS — Y929 Unspecified place or not applicable: Secondary | ICD-10-CM | POA: Insufficient documentation

## 2020-02-29 DIAGNOSIS — Z23 Encounter for immunization: Secondary | ICD-10-CM | POA: Diagnosis not present

## 2020-02-29 DIAGNOSIS — Z2914 Encounter for prophylactic rabies immune globin: Secondary | ICD-10-CM | POA: Diagnosis not present

## 2020-02-29 DIAGNOSIS — Y939 Activity, unspecified: Secondary | ICD-10-CM | POA: Diagnosis not present

## 2020-02-29 DIAGNOSIS — Y990 Civilian activity done for income or pay: Secondary | ICD-10-CM | POA: Insufficient documentation

## 2020-02-29 MED ORDER — RABIES VACCINE, PCEC IM SUSR
1.0000 mL | Freq: Once | INTRAMUSCULAR | Status: AC
  Administered 2020-02-29: 1 mL via INTRAMUSCULAR
  Filled 2020-02-29: qty 1

## 2020-02-29 MED ORDER — TETANUS-DIPHTH-ACELL PERTUSSIS 5-2.5-18.5 LF-MCG/0.5 IM SUSP
0.5000 mL | Freq: Once | INTRAMUSCULAR | Status: AC
  Administered 2020-02-29: 23:00:00 0.5 mL via INTRAMUSCULAR
  Filled 2020-02-29: qty 0.5

## 2020-02-29 MED ORDER — RABIES IMMUNE GLOBULIN 150 UNIT/ML IM INJ
20.0000 [IU]/kg | INJECTION | Freq: Once | INTRAMUSCULAR | Status: AC
  Administered 2020-02-29: 1650 [IU] via INTRAMUSCULAR
  Filled 2020-02-29: qty 11

## 2020-02-29 MED ORDER — DOXYCYCLINE HYCLATE 100 MG PO TABS
100.0000 mg | ORAL_TABLET | Freq: Once | ORAL | Status: AC
  Administered 2020-02-29: 100 mg via ORAL
  Filled 2020-02-29: qty 1

## 2020-02-29 MED ORDER — DOXYCYCLINE HYCLATE 100 MG PO TBEC: 100.0000 mg | DELAYED_RELEASE_TABLET | Freq: Two times a day (BID) | ORAL | 0 refills | Status: AC

## 2020-02-29 NOTE — ED Provider Notes (Signed)
[facility] EMERGENCY DEPARTMENT Provider Note   CSN: [id number] Arrival date & time: 02/29/20  2009     History Chief Complaint  Patient presents with  . Animal Bite    Maureen Mcbride is a 29 y.o. female presents to the emergency department evaluation of animal bite.  She was at work earlier today when she went to remove the dog from the road that was blocking traffic, the dog bit her in the chin.  Blood was drawn.  Patient's tetanus is not up-to-date and the dog's vaccine status unknown.  The dog was taken to the emergency room that where it was euthanized due to its injuries.  Animal control advised patient to get her rabies vaccine.  HPI     Past Medical History:  Diagnosis Date  . Hyperprolactinemia (HCC)   . Mental disorder   . PCOS (polycystic ovarian syndrome) 11/2016    Patient Active Problem List   Diagnosis Date Noted  . PCOS (polycystic ovarian syndrome) 05/17/2017    Past Surgical History:  Procedure Laterality Date  . APPENDECTOMY  2009   [facility]  . URETHRAL DILATION  1999   DR. KOWALSKI     OB History    Gravida  0   Para  0   Term  0   Preterm  0   AB  0   Living  0     SAB  0   TAB  0   Ectopic  0   Multiple  0   Live Births  0           Family History  Problem Relation Age of Onset  . Colon cancer Maternal Grandfather        60  . Diabetes Mellitus II Paternal Grandmother   . Uterine cancer Maternal Grandmother   . Migraines Maternal Grandmother   . Diabetes Mellitus II Father   . Hypertension Mother   . Migraines Mother   . Bipolar disorder Brother   . Polycystic ovary syndrome Sister     Social History   Tobacco Use  . Smoking status: Never Smoker  . Smokeless tobacco: Never Used  Substance Use Topics  . Alcohol use: Yes    Comment: socially  . Drug use: No    Home Medications Prior to Admission medications   Medication Sig Start Date End Date Taking? Authorizing Provider    cyclobenzaprine (FLEXERIL) 10 MG tablet Take 1 tablet (10 mg total) by mouth at bedtime. 02/19/20   Payton Mccallum, MD  doxycycline (DORYX) 100 MG EC tablet Take 1 tablet (100 mg total) by mouth 2 (two) times daily for 5 days. 02/29/20 03/05/20  Evon Slack, PA-C  HYDROcodone-acetaminophen (NORCO/VICODIN) 5-325 MG tablet 1-2 tabs po q 8 hours prn 02/19/20   Payton Mccallum, MD  Levonorgestrel [facility]) 19.5 MG IUD 1 Device by Intrauterine route once for 1 dose. 12/27/17 02/08/20  Conard Novak, MD  metFORMIN (GLUCOPHAGE) 500 MG tablet Take one tablet by mouth daily for one week. Then increase to one tablet twice a day for one week.  Then two tablets twice a day. 02/08/20   Lawhorn, Vanessa [place], CNM  ondansetron (ZOFRAN ODT) 8 MG disintegrating tablet Take 1 tablet (8 mg total) by mouth every 8 (eight) hours as needed. 02/19/20   Payton Mccallum, MD    Allergies    Penicillins  Review of Systems   Review of Systems  HENT: Negative for trouble swallowing.   Skin: Positive for  wound.    Physical Exam Updated Vital Signs BP (!) 145/93 (BP Location: Left Arm)   Pulse (!) 103   Temp 98.5 F (36.9 C) (Oral)   Resp 18   Ht 4\' 10"  (1.473 m)   Wt 81.6 kg   SpO2 99%   BMI 37.62 kg/m   Physical Exam Constitutional:      Appearance: She is well-developed.  HENT:     Head: Normocephalic.     Comments: Small abrasion superficial to the left chin, 0.5 cm. Eyes:     Conjunctiva/sclera: Conjunctivae normal.  Cardiovascular:     Rate and Rhythm: Normal rate.  Pulmonary:     Effort: Pulmonary effort is normal. No respiratory distress.  Musculoskeletal:        General: Normal range of motion.     Cervical back: Normal range of motion.  Skin:    General: Skin is warm.     Findings: No rash.  Neurological:     Mental Status: She is alert and oriented to person, place, and time.  Psychiatric:        Behavior: Behavior normal.        Thought Content: Thought content normal.     ED  Results / Procedures / Treatments   Labs (all labs ordered are listed, but only abnormal results are displayed) Labs Reviewed  POC URINE PREG, ED    EKG None  Radiology No results found.  Procedures Procedures (including critical care time)  Medications Ordered in ED Medications  doxycycline (VIBRA-TABS) tablet 100 mg (has no administration in time range)  Tdap (BOOSTRIX) injection 0.5 mL (has no administration in time range)  rabies immune globulin (HYPERAB/KEDRAB) injection 1,650 Units (has no administration in time range)  rabies vaccine (RABAVERT) injection 1 mL (has no administration in time range)    ED Course  I have reviewed the triage vital signs and the nursing notes.  Pertinent labs & imaging results that were available during my care of the patient were reviewed by me and considered in my medical decision making (see chart for details).    MDM Rules/Calculators/A&P                      29 year old female with Worker's Comp. injury, she was bit by an animal, dog's vaccine status unknown.  She is placed on prophylactic antibiotics, doxycycline due to penicillin allergy.  Tetanus is updated.  She is given rabies vaccine.  She understands to follow-up on days 3 7 and 14. Final Clinical Impression(s) / ED Diagnoses Final diagnoses:  Dog bite, initial encounter    Rx / DC Orders ED Discharge Orders         Ordered    doxycycline (DORYX) 100 MG EC tablet  2 times daily     02/29/20 2204           Renata Caprice 02/29/20 2209    Merlyn Lot, MD 02/29/20 2243

## 2020-02-29 NOTE — ED Notes (Signed)
Pt presents to ED via POV states was at work for [employer] and was bit by a dog. Pt with dog bit to L lower jaw.   This RN spoke with patient's supervisor Robyne Askew [phone], per Lucienne Minks, pt does not need UDS for WC at this time.

## 2020-02-29 NOTE — Discharge Instructions (Addendum)
Please return to the ER in 3, 7, 14 days for your second third and fourth rabies vaccinations.  You may call [place] urgent care to see if you can schedule an appointment for these injections.  If unable to schedule appointments, you will need to come to the ER.  Take antibiotics as prescribed to prevent infection.

## 2020-02-29 NOTE — ED Triage Notes (Signed)
Patient ambulatory to triage with steady gait, without difficulty or distress noted; pt employed with [employer]; reports was on a call, dog in road that was hit, went to pick dog up and it bit her on left jaw--small puncture noted with no active bleeding ;  Co deputy here to take report

## 2020-03-05 ENCOUNTER — Encounter: Payer: Self-pay | Admitting: Orthopedic Surgery

## 2020-05-06 ENCOUNTER — Ambulatory Visit (INDEPENDENT_AMBULATORY_CARE_PROVIDER_SITE_OTHER): Payer: Managed Care, Other (non HMO) | Admitting: Certified Nurse Midwife

## 2020-05-06 ENCOUNTER — Other Ambulatory Visit: Payer: Self-pay

## 2020-05-06 ENCOUNTER — Encounter: Payer: Self-pay | Admitting: Certified Nurse Midwife

## 2020-05-06 VITALS — BP 111/75 | HR 64 | Ht <= 58 in | Wt 185.2 lb

## 2020-05-06 DIAGNOSIS — E282 Polycystic ovarian syndrome: Secondary | ICD-10-CM | POA: Diagnosis not present

## 2020-05-06 DIAGNOSIS — Z319 Encounter for procreative management, unspecified: Secondary | ICD-10-CM

## 2020-05-06 DIAGNOSIS — N911 Secondary amenorrhea: Secondary | ICD-10-CM | POA: Diagnosis not present

## 2020-05-06 MED ORDER — LETROZOLE 2.5 MG PO TABS: 2.5000 mg | ORAL_TABLET | Freq: Every day | ORAL | 2 refills | Status: AC

## 2020-05-06 MED ORDER — METFORMIN HCL 500 MG PO TABS: ORAL_TABLET | ORAL | 5 refills | Status: DC

## 2020-05-06 MED ORDER — MEDROXYPROGESTERONE ACETATE 10 MG PO TABS: 10.0000 mg | ORAL_TABLET | Freq: Every day | ORAL | 2 refills | Status: AC

## 2020-05-06 NOTE — Progress Notes (Signed)
GYN ENCOUNTER NOTE  Subjective:       Maureen Mcbride is a 29 y.o. G0P0000 female is here for gynecologic evaluation of the following issues:  1.  Desires Pregnancy 2.  PCOS 3.  Second Amenorrhea   Notes no menses since Mirena removal in March.   States that due to PCOS only has one (1) to two (2) periods a year.   Desires pregnancy.   Denies difficulty breathing or respiratory distress, chest pain, abdominal pain, excessive vaginal bleeding, dysuria, leg pain or swelling   Gynecologic History  No LMP recorded (lmp unknown). (Menstrual status: Other).  Contraception: none   Last Pap: 01/22/19. Results were: Neg/Neg  Obstetric History  OB History  Gravida Para Term Preterm AB Living  0 0 0 0 0 0  SAB TAB Ectopic Multiple Live Births  0 0 0 0 0    Past Medical History:  Diagnosis Date  . Hyperprolactinemia ([place])   . Mental disorder   . PCOS (polycystic ovarian syndrome) 11/2016    Past Surgical History:  Procedure Laterality Date  . APPENDECTOMY  2009   [facility]  . [place]   DR. KOWALSKI    Current Outpatient Medications on File Prior to Visit  Medication Sig Dispense Refill  . metFORMIN (GLUCOPHAGE) 500 MG tablet Take one tablet by mouth daily for one week. Then increase to one tablet twice a day for one week.  Then two tablets twice a day. 60 tablet 5  . cyclobenzaprine (FLEXERIL) 10 MG tablet Take 1 tablet (10 mg total) by mouth at bedtime. 10 tablet 0  . HYDROcodone-acetaminophen (NORCO/VICODIN) 5-325 MG tablet 1-2 tabs po q 8 hours prn 6 tablet 0  . Levonorgestrel (KYLEENA) 19.5 MG IUD 1 Device by Intrauterine route once for 1 dose. 1 Intra Uterine Device 0  . ondansetron (ZOFRAN ODT) 8 MG disintegrating tablet Take 1 tablet (8 mg total) by mouth every 8 (eight) hours as needed. 6 tablet 0   No current facility-administered medications on file prior to visit.    Allergies  Allergen Reactions  . Penicillins Hives    Has patient had a PCN  reaction causing immediate rash, facial/tongue/throat swelling, SOB or lightheadedness with hypotension: Yes Has patient had a PCN reaction causing severe rash involving mucus membranes or skin necrosis: No Has patient had a PCN reaction that required hospitalization: No Has patient had a PCN reaction occurring within the last 10 years: No If all of the above answers are "NO", then may proceed with Cephalosporin use.    Social History   Socioeconomic History  . Marital status: Married    Spouse name: Not on file  . Number of children: 0  . Years of education: 8  . Highest education level: Not on file  Occupational History  . Not on file  Tobacco Use  . Smoking status: Never Smoker  . Smokeless tobacco: Never Used  Vaping Use  . Vaping Use: Never used  Substance and Sexual Activity  . Alcohol use: Yes    Comment: socially  . Drug use: No  . Sexual activity: Yes    Birth control/protection: None  Other Topics Concern  . Not on file  Social History Narrative  . Not on file   Social Determinants of Health   Financial Resource Strain:   . Difficulty of Paying Living Expenses:   Food Insecurity:   . Worried About Charity fundraiser in the Last Year:   . [employer]  of Food in the Last Year:   Transportation Needs:   . Freight forwarder (Medical):   Marland Kitchen Lack of Transportation (Non-Medical):   Physical Activity:   . Days of Exercise per Week:   . Minutes of Exercise per Session:   Stress:   . Feeling of Stress :   Social Connections:   . Frequency of Communication with Friends and Family:   . Frequency of Social Gatherings with Friends and Family:   . Attends Religious Services:   . Active Member of Clubs or Organizations:   . Attends Banker Meetings:   Marland Kitchen Marital Status:   Intimate Partner Violence:   . Fear of Current or Ex-Partner:   . Emotionally Abused:   Marland Kitchen Physically Abused:   . Sexually Abused:     Family History  Problem Relation Age of  Onset  . Colon cancer Maternal Grandfather        60  . Diabetes Mellitus II Paternal Grandmother   . Uterine cancer Maternal Grandmother   . Migraines Maternal Grandmother   . Diabetes Mellitus II Father   . Hypertension Mother   . Migraines Mother   . Bipolar disorder Brother   . Polycystic ovary syndrome Sister     The following portions of the patient's history were reviewed and updated as appropriate: allergies, current medications, past family history, past medical history, past social history, past surgical history and problem list.  Review of Systems  ROS- negative except as noted above. Information obtained from patient.   Objective:   BP 111/75   Pulse 64   Ht 4\' 10"  (1.473 m)   Wt 185 lb 4 oz (84 kg)   LMP  (LMP Unknown)   BMI 38.72 kg/m    CONSTITUTIONAL: Well-developed, well-nourished female in no acute distress.   PHYSICAL EXAM: Not Indicated   Assessment:   1. Secondary amenorrhea   2. PCOS (polycystic ovarian syndrome)   3. Patient desires pregnancy   Plan:   Provera Rx'd.  Femara Rx'd  Increased Metformin to two (2) tablets in the morning, one (1) at night for one week then increase to two (2) tablets twice a day.   Discussed PCOS diet. See handouts  Reviewed red flag symptoms and when to call the office.  RTC x 3 months or sooner if needed.  RN [facility] [employer] 05/06/20 12:05 PM

## 2020-05-06 NOTE — Progress Notes (Signed)
I have seen, interviewed, and examined the patient in conjunction with the [employer] Nurse Practitioner student and affirm the diagnosis and management plan.   Gunnar Bulla, CNM [facility], [facility] 05/06/20 2:20 PM

## 2020-05-06 NOTE — Patient Instructions (Signed)
Secondary Amenorrhea  Secondary amenorrhea occurs when a female who was previously having menstrual periods has not had them for 3-6 months. A menstrual period is the monthly shedding of the lining of the uterus. Menstruation involves the passing of blood, tissue, fluid, and mucus through the vagina. The flow of blood usually occurs during 3-7 consecutive days each month. This condition has many causes. In many cases, treating the underlying cause will return menstrual periods back to a normal cycle. What are the causes? The most common cause of this condition is pregnancy. Other causes include:  Malnutrition.  Cirrhosis of the liver.  Conditions of the blood.  Diabetes.  Epilepsy.  Chronic kidney disease.  Polycystic ovary disease.  Stress or anxiety.  A hormonal imbalance.  Ovarian failure.  Medicines.  Extreme obesity.  Cystic fibrosis.  Low body weight or drastic weight loss.  Early menopause.  Removal of the ovaries or uterus.  Contraceptive pills, patches, or vaginal rings.  Cushing syndrome.  Thyroid problems. What increases the risk? You are more likely to develop this condition if:  You have a family history of this condition.  You have an eating disorder.  You do extreme athletic training.  You have a chronic disease.  You abuse substances such as alcohol or cigarettes. What are the signs or symptoms? The main symptom of this condition is a lack of menstrual periods for 3-6 months. How is this diagnosed? This condition may be diagnosed based on:  Your medical history.  A physical exam.  A pelvic exam to check for problems with your reproductive organs.  A procedure to examine the uterus.  A measurement of your body mass index (BMI).  Tests, such as: ? Blood tests that measure certain hormones in your body and rule out pregnancy. ? Urine tests. ? Imaging tests, such as an ultrasound, CT scan, or MRI. How is this treated? Treatment  for this condition depends on the cause of the amenorrhea. It may involve:  Correcting dietary problems.  Treating underlying conditions.  Medicines.  Lifestyle changes.  Surgery. If the condition cannot be corrected, it is sometimes possible to trigger menstrual periods with medicines. Follow these instructions at home: Lifestyle  Maintain a healthy diet. Ask to meet with a registered dietitian for nutrition counseling and meal planning.  Maintain a healthy weight. Talk to your health care provider before trying any new diet or exercise plan.  Exercise at least 30 minutes 5 or more days each week. Exercising includes brisk walking, yard work, biking, running, swimming, and team sports like basketball and soccer. Ask your health care provider which exercises are safe for you.  Get enough sleep. Plan your sleep time to allow for 7-9 hours of sleep each night.  Learn to manage stress. Explore relaxation techniques such as meditation, journaling, yoga, or tai chi. General instructions  Be aware of changes in your menstrual cycle. Keep a record of when you have your menstrual period. Note the date your period starts, how long it lasts, and any problems you experience.  Take over-the-counter and prescription medicines only as told by your health care provider.  Keep all follow-up visits as told by your health care provider. This is important. Contact a health care provider if:  Your periods do not return to normal after treatment. Summary  Secondary amenorrhea is when a female who was previously having menstrual periods has not gotten her period for 3-6 months.  This condition has many causes. In many cases, treating the underlying   cause will return menstrual periods back to a normal cycle.  Talk to your health care provider if your periods do not return to normal after treatment. This information is not intended to replace advice given to you by your health care provider. Make  sure you discuss any questions you have with your health care provider. Document Revised: 04/21/2019 Document Reviewed: 01/24/2017 Elsevier Patient Education  2020 [employer]   Polycystic Ovarian Syndrome  Polycystic ovarian syndrome (PCOS) is a common hormonal disorder among women of reproductive age. In most women with PCOS, many small fluid-filled sacs (cysts) grow on the ovaries, and the cysts are not part of a normal menstrual cycle. PCOS can cause problems with your menstrual periods and make it difficult to get pregnant. It can also cause an increased risk of miscarriage with pregnancy. If it is not treated, PCOS can lead to serious health problems, such as diabetes and heart disease. What are the causes? The cause of PCOS is not known, but it may be the result of a combination of certain factors, such as:  Irregular menstrual cycle.  High levels of certain hormones (androgens).  Problems with the hormone that helps to control blood sugar (insulin resistance).  Certain genes. What increases the risk? This condition is more likely to develop in women who have a family history of PCOS. What are the signs or symptoms? Symptoms of PCOS may include:  Multiple ovarian cysts.  Infrequent periods or no periods.  Periods that are too frequent or too heavy.  Unpredictable periods.  Inability to get pregnant (infertility) because of not ovulating.  Increased growth of hair on the face, chest, stomach, back, thumbs, thighs, or toes.  Acne or oily skin. Acne may develop during adulthood, and it may not respond to treatment.  Pelvic pain.  Weight gain or obesity.  Patches of thickened and dark brown or black skin on the neck, arms, breasts, or thighs (acanthosis nigricans).  Excess hair growth on the face, chest, abdomen, or upper thighs (hirsutism). How is this diagnosed? This condition is diagnosed based on:  Your medical history.  A physical exam, including a pelvic  exam. Your health care provider may look for areas of increased hair growth on your skin.  Tests, such as: ? Ultrasound. This may be used to examine the ovaries and the lining of the uterus (endometrium) for cysts. ? Blood tests. These may be used to check levels of sugar (glucose), female hormone (testosterone), and female hormones (estrogen and progesterone) in your blood. How is this treated? There is no cure for PCOS, but treatment can help to manage symptoms and prevent more health problems from developing. Treatment varies depending on:  Your symptoms.  Whether you want to have a baby or whether you need birth control (contraception). Treatment may include nutrition and lifestyle changes along with:  Progesterone hormone to start a menstrual period.  Birth control pills to help you have regular menstrual periods.  Medicines to make you ovulate, if you want to get pregnant.  Medicine to reduce excessive hair growth.  Surgery, in severe cases. This may involve making small holes in one or both of your ovaries. This decreases the amount of testosterone that your body produces. Follow these instructions at home:  Take over-the-counter and prescription medicines only as told by your health care provider.  Follow a healthy meal plan. This can help you reduce the effects of PCOS. ? Eat a healthy diet that includes lean proteins, complex carbohydrates, fresh fruits  and vegetables, low-fat dairy products, and healthy fats. Make sure to eat enough fiber.  If you are overweight, lose weight as told by your health care provider. ? Losing 10% of your body weight may improve symptoms. ? Your health care provider can determine how much weight loss is best for you and can help you lose weight safely.  Keep all follow-up visits as told by your health care provider. This is important. Contact a health care provider if:  Your symptoms do not get better with medicine.  You develop new  symptoms. This information is not intended to replace advice given to you by your health care provider. Make sure you discuss any questions you have with your health care provider. Document Revised: 10/18/2017 Document Reviewed: 04/22/2016 Elsevier Patient Education  2020 [place] for Polycystic Ovary Syndrome Polycystic ovary syndrome (PCOS) is a disorder of the chemicals (hormones) that regulate a woman's reproductive system, including monthly periods (menstruation). The condition causes important hormones to be out of balance. PCOS can:  Stop your periods or make them irregular.  Cause cysts to develop on your ovaries.  Make it difficult to get pregnant.  Stop your body from responding to the effects of insulin (insulin resistance). Insulin resistance can lead to obesity and diabetes. Changing what you eat can help you manage PCOS and improve your health. Following a balanced diet can help you lose weight and improve the way that your body uses insulin. What are tips for following this plan?  Follow a balanced diet for meals and snacks. Eat breakfast, lunch, dinner, and one or two snacks every day.  Include protein in each meal and snack.  Choose whole grains instead of products that are made with refined flour.  Eat a variety of foods.  Exercise regularly as told by your health care provider. Aim to do 30 or more minutes of exercise on most days of the week.  If you are overweight or obese: ? Pay attention to how many calories you eat. Cutting down on calories can help you lose weight. ? Work with your health care provider or a diet and nutrition specialist (dietitian) to figure out how many calories you need each day. What foods can I eat?  Fruits Include a variety of colors and types. All fruits are helpful for PCOS. Vegetables Include a variety of colors and types. All vegetables are helpful for PCOS. Grains Whole grains, such as whole wheat. Whole-grain  breads, crackers, cereals, and pasta. Unsweetened oatmeal, bulgur, barley, quinoa, and brown rice. Tortillas made from corn or whole-wheat flour. Meats and other proteins Low-fat (lean) proteins, such as fish, chicken, beans, eggs, and tofu. Dairy Low-fat dairy products, such as skim milk, cheese sticks, and yogurt. Beverages Low-fat or fat-free drinks, such as water, low-fat milk, sugar-free drinks, and small amounts of 100% fruit juice. Seasonings and condiments Ketchup. Mustard. Barbecue sauce. Relish. Low-fat or fat-free mayonnaise. Fats and oils Olive oil or canola oil. Walnuts and almonds. The items listed above may not be a complete list of recommended foods and beverages. Contact a dietitian for more options. What foods are not recommended? Foods that are high in calories or fat. Fried foods. Sweets. Products that are made from refined white flour, including white bread, pastries, white rice, and pasta. The items listed above may not be a complete list of foods and beverages to avoid. Contact a dietitian for more information. Summary  PCOS is a hormonal imbalance that affects a woman's reproductive  system.  You can help to manage your PCOS by exercising regularly and eating a healthy, varied diet of vegetables, fruit, whole grains, low-fat (lean) protein, and low-fat dairy products.  Changing what you eat can improve the way that your body uses insulin, help your hormones reach normal levels, and help you lose weight. This information is not intended to replace advice given to you by your health care provider. Make sure you discuss any questions you have with your health care provider. Document Revised: 02/25/2019 Document Reviewed: 09/09/2017 Elsevier Patient Education  2020 [employer].   Eating Plan for Pregnant Women While you are pregnant, your body requires additional nutrition to help support your growing baby. You also have a higher need for some vitamins and minerals,  such as folic acid, calcium, iron, and vitamin D. Eating a healthy, well-balanced diet is very important for your health and your baby's health. Your need for extra calories varies for the three 71-month segments of your pregnancy (trimesters). For most women, it is recommended to consume:  150 extra calories a day during the first trimester.  300 extra calories a day during the second trimester.  300 extra calories a day during the third trimester. What are tips for following this plan?   Do not try to lose weight or go on a diet during pregnancy.  Limit your overall intake of foods that have "empty calories." These are foods that have little nutritional value, such as sweets, desserts, candies, and sugar-sweetened beverages.  Eat a variety of foods (especially fruits and vegetables) to get a full range of vitamins and minerals.  Take a prenatal vitamin to help meet your additional vitamin and mineral needs during pregnancy, specifically for folic acid, iron, calcium, and vitamin D.  Remember to stay active. Ask your health care provider what types of exercise and activities are safe for you.  Practice good food safety and cleanliness. Wash your hands before you eat and after you prepare raw meat. Wash all fruits and vegetables well before peeling or eating. Taking these actions can help to prevent food-borne illnesses that can be very dangerous to your baby, such as listeriosis. Ask your health care provider for more information about listeriosis. What does 150 extra calories look like? Healthy options that provide 150 extra calories each day could be any of the following:  6-8 oz (170-230 g) of plain low-fat yogurt with  cup of berries.  1 apple with 2 teaspoons (11 g) of peanut butter.  Cut-up vegetables with  cup (60 g) of hummus.  8 oz (230 mL) or 1 cup of low-fat chocolate milk.  1 stick of string cheese with 1 medium orange.  1 peanut butter and jelly sandwich that is made  with one slice of whole-wheat bread and 1 tsp (5 g) of peanut butter. For 300 extra calories, you could eat two of those healthy options each day. What is a healthy amount of weight to gain? The right amount of weight gain for you is based on your BMI before you became pregnant. If your BMI:  Was less than 18 (underweight), you should gain 28-40 lb (13-18 kg).  Was 18-24.9 (normal), you should gain 25-35 lb (11-16 kg).  Was 25-29.9 (overweight), you should gain 15-25 lb (7-11 kg).  Was 30 or greater (obese), you should gain 11-20 lb (5-9 kg). What if I am having twins or multiples? Generally, if you are carrying twins or multiples:  You may need to eat 300-600 extra calories  a day.  The recommended range for total weight gain is 25-54 lb (11-25 kg), depending on your BMI before pregnancy.  Talk with your health care provider to find out about nutritional needs, weight gain, and exercise that is right for you. What foods can I eat?  Fruits All fruits. Eat a variety of colors and types of fruit. Remember to wash your fruits well before peeling or eating. Vegetables All vegetables. Eat a variety of colors and types of vegetables. Remember to wash your vegetables well before peeling or eating. Grains All grains. Choose whole grains, such as whole-wheat bread, oatmeal, or brown rice. Meats and other protein foods Lean meats, including chicken, [place], fish, and lean cuts of beef, veal, or pork. If you eat fish or seafood, choose options that are higher in omega-3 fatty acids and lower in mercury, such as salmon, herring, mussels, trout, sardines, pollock, shrimp, crab, and lobster. Tofu. Tempeh. Beans. Eggs. Peanut butter and other nut butters. Make sure that all meats, poultry, and eggs are cooked to food-safe temperatures or "well-done." Two or more servings of fish are recommended each week in order to get the most benefits from omega-3 fatty acids that are found in seafood. Choose fish  that are lower in mercury. You can find more information online:  [employer] Dairy Pasteurized milk and milk alternatives (such as almond milk). Pasteurized yogurt and pasteurized cheese. Cottage cheese. Sour cream. Beverages Water. Juices that contain 100% fruit juice or vegetable juice. Caffeine-free teas and decaffeinated coffee. Drinks that contain caffeine are okay to drink, but it is better to avoid caffeine. Keep your total caffeine intake to less than 200 mg each day (which is 12 oz or 355 mL of coffee, tea, or soda) or the limit as told by your health care provider. Fats and oils Fats and oils are okay to include in moderation. Sweets and desserts Sweets and desserts are okay to include in moderation. Seasoning and other foods All pasteurized condiments. The items listed above may not be a complete list of foods and beverages you can eat. Contact a dietitian for more information. What foods are not recommended? Fruits Unpasteurized fruit juices. Vegetables Raw (unpasteurized) vegetable juices. Meats and other protein foods Lunch meats, bologna, hot dogs, or other deli meats. (If you must eat those meats, reheat them until they are steaming hot.) Refrigerated pat, meat spreads from a meat counter, smoked seafood that is found in the refrigerated section of a store. Raw or undercooked meats, poultry, and eggs. Raw fish, such as sushi or sashimi. Fish that have high mercury content, such as tilefish, shark, swordfish, and king mackerel. To learn more about mercury in fish, talk with your health care provider or look for online resources, such as:  [employer] Dairy Raw (unpasteurized) milk and any foods that have raw milk in them. Soft cheeses, such as feta, queso blanco, queso fresco, Brie, Camembert cheeses, blue-veined cheeses, and Panela cheese (unless it is made with pasteurized milk, which must be stated on the label). Beverages Alcohol. Sugar-sweetened beverages, such as  sodas, teas, or energy drinks. Seasoning and other foods Homemade fermented foods and drinks, such as pickles, sauerkraut, or kombucha drinks. (Store-bought pasteurized versions of these are okay.) Salads that are made in a store or deli, such as ham salad, chicken salad, egg salad, tuna salad, and seafood salad. The items listed above may not be a complete list of foods and beverages you should avoid. Contact a dietitian for more information. Where to find  more information To calculate the number of calories you need based on your height, weight, and activity level, you can use an online calculator such as:  [employer]Transition.ch To calculate how much weight you should gain during pregnancy, you can use an online pregnancy weight gain calculator such as:  [employer].cy Summary  While you are pregnant, your body requires additional nutrition to help support your growing baby.  Eat a variety of foods, especially fruits and vegetables to get a full range of vitamins and minerals.  Practice good food safety and cleanliness. Wash your hands before you eat and after you prepare raw meat. Wash all fruits and vegetables well before peeling or eating. Taking these actions can help to prevent food-borne illnesses, such as listeriosis, that can be very dangerous to your baby.  Do not eat raw meat or fish. Do not eat fish that have high mercury content, such as tilefish, shark, swordfish, and king mackerel. Do not eat unpasteurized (raw) dairy.  Take a prenatal vitamin to help meet your additional vitamin and mineral needs during pregnancy, specifically for folic acid, iron, calcium, and vitamin D. This information is not intended to replace advice given to you by your health care provider. Make sure you discuss any questions you have with your health care provider. Document Revised: 03/26/2019 Document Reviewed: 08/02/2017 Elsevier Patient Education   [place].

## 2020-08-04 ENCOUNTER — Encounter: Payer: Self-pay | Admitting: Certified Nurse Midwife

## 2020-08-09 ENCOUNTER — Encounter: Payer: Self-pay | Admitting: Certified Nurse Midwife

## 2020-12-26 ENCOUNTER — Encounter: Payer: Self-pay | Admitting: Certified Nurse Midwife

## 2020-12-26 ENCOUNTER — Other Ambulatory Visit: Payer: Self-pay

## 2020-12-26 ENCOUNTER — Ambulatory Visit (INDEPENDENT_AMBULATORY_CARE_PROVIDER_SITE_OTHER): Payer: Self-pay | Admitting: Certified Nurse Midwife

## 2020-12-26 VITALS — BP 118/85 | HR 72 | Resp 16 | Wt 183.0 lb

## 2020-12-26 DIAGNOSIS — Z86018 Personal history of other benign neoplasm: Secondary | ICD-10-CM

## 2020-12-26 DIAGNOSIS — N939 Abnormal uterine and vaginal bleeding, unspecified: Secondary | ICD-10-CM

## 2020-12-26 DIAGNOSIS — E282 Polycystic ovarian syndrome: Secondary | ICD-10-CM

## 2020-12-26 DIAGNOSIS — Z8049 Family history of malignant neoplasm of other genital organs: Secondary | ICD-10-CM

## 2020-12-26 NOTE — Patient Instructions (Signed)
Polycystic Ovary Syndrome  Polycystic ovarian syndrome (PCOS) is a common hormonal disorder among women of reproductive age. In most women with PCOS, small fluid-filled sacs (cysts) grow on the ovaries. PCOS can cause problems with menstrual periods and make it hard to get and stay pregnant. If this condition is not treated, it can lead to serious health problems, such as diabetes and heart disease. What are the causes? The cause of this condition is not known. It may be due to certain factors, such as:  Irregular menstrual cycle.  High levels of certain hormones.  Problems with the hormone that helps to control blood sugar (insulin).  Certain genes. What increases the risk? You are more likely to develop this condition if you:  Have a family history of PCOS or type 2 diabetes.  Are overweight, eat unhealthy foods, and are not active. These factors may cause problems with blood sugar control, which can contribute to PCOS or PCOS symptoms. What are the signs or symptoms? Symptoms of this condition include:  Ovarian cysts and sometimes pelvic pain.  Menstrual periods that are not regular or are too heavy.  Inability to get or stay pregnant.  Increased growth of hair on the face, chest, stomach, back, thumbs, thighs, or toes.  Acne or oily skin. Acne may develop during adulthood, and it may not get better with treatment.  Weight gain or obesity.  Patches of thickened and dark brown or black skin on the neck, arms, breasts, or thighs. How is this diagnosed? This condition is diagnosed based on:  Your medical history.  A physical exam that includes a pelvic exam. Your health care provider may look for areas of increased hair growth on your skin.  Tests, such as: ? An ultrasound to check the ovaries for cysts and to view the lining of the uterus. ? Blood tests to check levels of sugar (glucose), female hormone (testosterone), and female hormones (estrogen and progesterone). How  is this treated? There is no cure for this condition, but treatment can help to manage symptoms and prevent more health problems from developing. Treatment varies depending on your symptoms and if you want to have a baby or if you need birth control. Treatment may include:  Making nutrition and lifestyle changes.  Taking the progesterone hormone to start a menstrual period.  Taking birth control pills to help you have regular menstrual periods.  Taking medicines such as: ? Medicines to make you ovulate, if you want to get pregnant. ? Medicine to reduce extra hair growth.  Having surgery in severe cases. This may involve making small holes in one or both of your ovaries. This decreases the amount of testosterone that your body makes. Follow these instructions at home:  Take over-the-counter and prescription medicines only as told by your health care provider.  Follow a healthy meal plan that includes lean proteins, complex carbohydrates, fresh fruits and vegetables, low-fat dairy products, healthy fats, and fiber.  If you are overweight, lose weight as told by your health care provider. Your health care provider can determine how much weight loss is best for you and can help you lose weight safely.  Keep all follow-up visits. This is important. Contact a health care provider if:  Your symptoms do not get better with medicine.  Your symptoms get worse or you develop new symptoms. Summary  Polycystic ovarian syndrome (PCOS) is a common hormonal disorder among women of reproductive age.  PCOS can cause problems with menstrual periods and make it hard  to get and stay pregnant.  If this condition is not treated, it can lead to serious health problems, such as diabetes and heart disease.  There is no cure for this condition, but treatment can help to manage symptoms and prevent more health problems from developing. This information is not intended to replace advice given to you by your  health care provider. Make sure you discuss any questions you have with your health care provider. Document Revised: 04/14/2020 Document Reviewed: 04/14/2020 Elsevier Patient Education  2021 [place].   Hirsutism and Masculinization Hirsutism is when a female has an excessive amount of hair in an area where a female would typically have hair growth, such as on the face, chest, or back. Masculinization is when a female's body develops certain characteristics that are like a female's body. What are the causes? This condition may be caused by:  Polycystic ovary syndrome (PCOS). This is the most common cause.  Certain medicines, such as androgens and anabolic steroids.  Cushing's syndrome.  Tumors.  Increased levels of androgen (testosterone). What increases the risk? The following factors may make you more likely to develop this condition:  Family history.  Obesity. What are the signs or symptoms? Hirsutism Symptoms of this condition include excess hair growth in areas where women typically do not grow hair, such as on the:  Face.  Chest.  Thighs.  Back. Masculinization Symptoms of this condition include:  Deepening voice.  Decreased breast size.  Increased muscle mass.  Thinning hair on the head (balding).  Irregular or no menstrual periods.  Enlarged clitoris. How is this diagnosed? These conditions may be diagnosed based on:  Your medical history.  A physical exam.  Tests that may include: ? Blood tests. ? Imaging studies. Your health care provider may recommend that you follow up with specialists to understand the cause of your condition or to help treat it. How is this treated? This condition may be treated by:  Taking medicines to help control hair growth.  Making lifestyle changes to help reduce hormone levels that are contributing to your condition.  Removing unwanted hair by shaving, using creams, or waxing. More permanent ways to remove  hair include electrolysis and laser treatments. If your symptoms are caused by certain medicines, your health care provider may have you stop taking them.   Follow these instructions at home:  Take over-the-counter and prescription medicines only as told by your health care provider.  Talk with your health care provider about your treatment options and what may be best for you.  Maintain a healthy weight. If needed, talk with your health care provider about how to lose weight.  Keep all follow-up visits. This is important. Contact a health care provider if:  Your symptoms suddenly get worse.  You develop acne.  You have irregular menstrual periods.  You stop having your menstrual period.  You feel a lump in your lower abdomen. Summary  Hirsutism is when a female has an excessive amount of hair in an area where a female would typically have hair growth, such as on the face, chest, thighs, or back.  Masculinization is when a female's body develops characteristics like a female's body. This may include a deepening voice, decreased breast size, increased muscle mass, thinning hair (balding), changes in menstrual periods, and clitoris growth.  The most common cause of this condition is polycystic ovary syndrome (PCOS).  Treatment options include removing unwanted hair, taking medicines, and making lifestyle changes. Talk with your health care  provider about which treatments are best for you.  Your health care provider may refer you to specialists to find the cause of your condition. This information is not intended to replace advice given to you by your health care provider. Make sure you discuss any questions you have with your health care provider. Document Revised: 04/14/2020 Document Reviewed: 04/14/2020 Elsevier Patient Education  [place].   Diet for Polycystic Ovary Syndrome Polycystic ovary syndrome (PCOS) is a common hormonal disorder that affects a woman's  reproductive system. It can cause problems with menstrual periods and make it hard to get and stay pregnant. Changing what you eat can help your hormones reach normal levels, improve your health, and help you better manage PCOS. Following a balanced diet can help you lose weight and improve the way that your body uses the hormone insulin to control blood sugar. This may include:  Eating low-fat (lean) proteins, complex carbohydrates, fresh fruits and vegetables, low-fat dairy products, healthy fats, and fiber.  Cutting down on calories.  Exercising regularly. What are tips for following this plan?  Follow a balanced diet for meals and snacks. Eat breakfast, lunch, dinner, and one or two snacks every day.  Include protein in each meal and snack.  Choose whole grains instead of products that are made with refined flour.  Eat a variety of foods.  Exercise regularly as told by your health care provider. Aim to do at least 30 minutes of exercise on most days of the week.  If you are overweight or obese: ? Pay attention to how many calories you eat. Cutting down on calories can help you lose weight. ? Work with your health care provider or a dietitian to figure out how many calories you need each day. What foods should I eat? Fruits Include a variety of colors and types. All fruits are helpful for PCOS. Vegetables Include a variety of colors and types. All vegetables are helpful for PCOS. Grains Whole grains, such as whole wheat. Whole-grain breads, crackers, cereals, and pasta. Unsweetened oatmeal. Bulgur, barley, quinoa, and brown rice. Tortillas made from corn or whole-wheat flour. Meats and other proteins Lean proteins, such as fish, chicken, beans, eggs, and tofu. Dairy Low-fat dairy products, such as skim milk, cheese sticks, and yogurt. Beverages Low-fat or fat-free drinks, such as water, low-fat milk, sugar-free drinks, and small amounts of 100% fruit juice. Seasonings and  condiments Ketchup. Mustard. Barbecue sauce. Relish. Low-fat or fat-free mayonnaise. Fats and oils Olive oil or canola oil. Walnuts and almonds. The items listed above may not be a complete list of recommended foods and beverages. Contact a dietitian for more options.   What foods should I avoid? Foods that are high in calories or fat, especially saturated or trans fats. Fried foods. Sweets. Products that are made from refined white flour, including white bread, pastries, white rice, and pasta. The items listed above may not be a complete list of foods and beverages to avoid. Contact a dietitian for more information. Summary  PCOS is a hormonal imbalance that affects a woman's reproductive system. It can cause problems with menstrual periods and make it hard to get and stay pregnant.  You can help to manage your PCOS by exercising regularly and eating a healthy, varied diet of vegetables, fruit, whole grains, lean protein, and low-fat dairy products.  Changing what you eat can improve the way that your body uses insulin, help your hormones reach normal levels, and help you lose weight. This information is  not intended to replace advice given to you by your health care provider. Make sure you discuss any questions you have with your health care provider. Document Revised: 04/14/2020 Document Reviewed: 04/14/2020 Elsevier Patient Education  2021 [employer].

## 2020-12-26 NOTE — Progress Notes (Signed)
GYN ENCOUNTER NOTE  Subjective:       Maureen Mcbride is a 30 y.o. G0P0000 female is here for gynecologic evaluation of the following issues:  1. Irregular bleeding for the five (5) to six (6) months ranging from brown spotting to bright red heavy bleeding with clots  Taking 2000 mg of Metformin daily. Attempted single round of ovulation induction with Femara, but stopped after receiving a promotion at work.   Denies difficulty breathing or respiratory distress, chest pain, abdominal pain, dysuria, and leg pain or swelling.   Personal history significant for past pituitary adenoma. Family history significant for uterine and cervical cancer.   Gynecologic History  No LMP recorded. (Menstrual status: Other).  Contraception: none  Last Pap: 01/2019. Results were: normal  Obstetric History  OB History  Gravida Para Term Preterm AB Living  0 0 0 0 0 0  SAB IAB Ectopic Multiple Live Births  0 0 0 0 0    Past Medical History:  Diagnosis Date  . Hyperprolactinemia (HCC)   . Mental disorder   . PCOS (polycystic ovarian syndrome) 11/2016    Past Surgical History:  Procedure Laterality Date  . APPENDECTOMY  2009   [facility]  . URETHRAL DILATION  1999   DR. KOWALSKI    Current Outpatient Medications on File Prior to Visit  Medication Sig Dispense Refill  . metFORMIN (GLUCOPHAGE) 500 MG tablet Take one tablet by mouth daily for one week. Then increase to one tablet twice a day for one week.  Then two tablets twice a day. 60 tablet 5  . letrozole (FEMARA) 2.5 MG tablet Take 1 tablet (2.5 mg total) by mouth daily. (Patient not taking: Reported on 12/26/2020) 5 tablet 2  . medroxyPROGESTERone (PROVERA) 10 MG tablet Take 1 tablet (10 mg total) by mouth daily. Use for ten days (Patient not taking: Reported on 12/26/2020) 10 tablet 2   No current facility-administered medications on file prior to visit.    Allergies  Allergen Reactions  . Penicillins Hives    Has patient had a PCN reaction  causing immediate rash, facial/tongue/throat swelling, SOB or lightheadedness with hypotension: Yes Has patient had a PCN reaction causing severe rash involving mucus membranes or skin necrosis: No Has patient had a PCN reaction that required hospitalization: No Has patient had a PCN reaction occurring within the last 10 years: No If all of the above answers are "NO", then may proceed with Cephalosporin use.    Social History   Socioeconomic History  . Marital status: Married    Spouse name: Not on file  . Number of children: 0  . Years of education: 9  . Highest education level: Not on file  Occupational History  . Not on file  Tobacco Use  . Smoking status: Never Smoker  . Smokeless tobacco: Never Used  Vaping Use  . Vaping Use: Never used  Substance and Sexual Activity  . Alcohol use: Yes    Comment: socially  . Drug use: No  . Sexual activity: Yes    Birth control/protection: None  Other Topics Concern  . Not on file  Social History Narrative  . Not on file   Social Determinants of Health   Financial Resource Strain: Not on file  Food Insecurity: Not on file  Transportation Needs: Not on file  Physical Activity: Not on file  Stress: Not on file  Social Connections: Not on file  Intimate Partner Violence: Not on file    Family History  Problem Relation Age of Onset  . Colon cancer Maternal Grandfather        60  . Diabetes Mellitus II Paternal Grandmother   . Uterine cancer Maternal Grandmother   . Migraines Maternal Grandmother   . Diabetes Mellitus II Father   . Hypertension Mother   . Migraines Mother   . Bipolar disorder Brother   . Polycystic ovary syndrome Sister     The following portions of the patient's history were reviewed and updated as appropriate: allergies, current medications, past family history, past medical history, past social history, past surgical history and problem list.  Review of Systems  ROS negative except as noted above.  Information obtained from patient.   Objective:   BP 118/85   Pulse 72   Resp 16   Wt 183 lb (83 kg)   BMI 38.25 kg/m    CONSTITUTIONAL: Well-developed, well-nourished female in no acute distress.   PHYSICAL EXAM: Not indicated.   Assessment:   1. PCOS (polycystic ovarian syndrome)  - CBC - FSH/LH - TSH+T4F+T3Free - Prolactin - Hemoglobin A1c - DHEA-sulfate - Testosterone, Free, Total, SHBG - US PELVIS (TRANSABDOMINAL ONLY); Future - US PELVIS TRANSVAGINAL NON-OB (TV ONLY); Future  2. Abnormal uterine bleeding  - CBC - FSH/LH - TSH+T4F+T3Free - Prolactin - Hemoglobin A1c - DHEA-sulfate - Testosterone, Free, Total, SHBG - US PELVIS (TRANSABDOMINAL ONLY); Future - US PELVIS TRANSVAGINAL NON-OB (TV ONLY); Future  3. Family history of uterine cancer   4. Family history of cervical cancer   5. History of pituitary adenoma  - Prolactin   Plan:   Labs today, see orders.   Discussed referral to reproductive endocrinology if needed.   Reviewed red flag symptoms and when to call.   RTC x 1-2 weeks for ultrasound and follow up or sooner if needed.    Serafina Royals, CNM [facility], [facility] 12/26/20 11:25 AM

## 2020-12-27 LAB — CBC
Hematocrit: 40.6 % (ref 34.0–46.6)
Hemoglobin: 14 g/dL (ref 11.1–15.9)
MCH: 29.2 pg (ref 26.6–33.0)
MCHC: 34.5 g/dL (ref 31.5–35.7)
MCV: 85 fL (ref 79–97)
Platelets: 302 10*3/uL (ref 150–450)
RBC: 4.8 x10E6/uL (ref 3.77–5.28)
RDW: 12.3 % (ref 11.7–15.4)
WBC: 6.5 10*3/uL (ref 3.4–10.8)

## 2020-12-27 LAB — TSH+T4F+T3FREE
Free T4: 1.34 ng/dL (ref 0.82–1.77)
T3, Free: 3.4 pg/mL (ref 2.0–4.4)
TSH: 1.33 u[IU]/mL (ref 0.450–4.500)

## 2020-12-27 LAB — TESTOSTERONE, FREE, TOTAL, SHBG
Sex Hormone Binding: 19.3 nmol/L — ABNORMAL LOW (ref 24.6–122.0)
Testosterone, Free: 1.8 pg/mL (ref 0.0–4.2)
Testosterone: 22 ng/dL (ref 13–71)

## 2020-12-27 LAB — FSH/LH
FSH: 5.1 m[IU]/mL
LH: 7.5 m[IU]/mL

## 2020-12-27 LAB — HEMOGLOBIN A1C
Est. average glucose Bld gHb Est-mCnc: 108 mg/dL
Hgb A1c MFr Bld: 5.4 % (ref 4.8–5.6)

## 2020-12-27 LAB — PROLACTIN: Prolactin: 12.5 ng/mL (ref 4.8–23.3)

## 2020-12-27 LAB — DHEA-SULFATE: DHEA-SO4: 42.6 ug/dL — ABNORMAL LOW (ref 84.8–378.0)

## 2021-01-12 ENCOUNTER — Encounter: Payer: Self-pay | Admitting: Certified Nurse Midwife

## 2021-01-12 ENCOUNTER — Other Ambulatory Visit: Payer: Self-pay

## 2021-01-19 ENCOUNTER — Encounter: Payer: Self-pay | Admitting: Certified Nurse Midwife

## 2021-01-19 ENCOUNTER — Other Ambulatory Visit: Payer: Self-pay

## 2021-01-23 ENCOUNTER — Other Ambulatory Visit: Payer: Self-pay | Admitting: Certified Nurse Midwife

## 2021-01-23 DIAGNOSIS — N939 Abnormal uterine and vaginal bleeding, unspecified: Secondary | ICD-10-CM

## 2021-01-23 DIAGNOSIS — E282 Polycystic ovarian syndrome: Secondary | ICD-10-CM

## 2021-01-23 NOTE — Progress Notes (Signed)
Ultrasound ordered, see chart.    Serafina Royals, CNM [facility], [facility] 01/23/21 5:26 PM

## 2021-01-27 ENCOUNTER — Other Ambulatory Visit: Payer: Self-pay

## 2021-01-27 ENCOUNTER — Ambulatory Visit
Admission: RE | Admit: 2021-01-27 | Discharge: 2021-01-27 | Disposition: A | Discharge status: Home / Self Care | Payer: Commercial Managed Care - PPO | Source: Ambulatory Visit | Attending: Certified Nurse Midwife | Admitting: Certified Nurse Midwife

## 2021-01-27 DIAGNOSIS — E282 Polycystic ovarian syndrome: Secondary | ICD-10-CM | POA: Diagnosis present

## 2021-01-27 DIAGNOSIS — N939 Abnormal uterine and vaginal bleeding, unspecified: Secondary | ICD-10-CM | POA: Insufficient documentation

## 2021-02-10 ENCOUNTER — Encounter: Payer: Self-pay | Admitting: Certified Nurse Midwife

## 2021-02-14 ENCOUNTER — Encounter: Payer: Self-pay | Admitting: Certified Nurse Midwife

## 2021-02-23 ENCOUNTER — Encounter: Payer: Self-pay | Admitting: Certified Nurse Midwife

## 2021-07-17 ENCOUNTER — Ambulatory Visit (INDEPENDENT_AMBULATORY_CARE_PROVIDER_SITE_OTHER): Payer: Managed Care, Other (non HMO) | Admitting: Obstetrics and Gynecology

## 2021-07-17 ENCOUNTER — Other Ambulatory Visit: Payer: Self-pay

## 2021-07-17 ENCOUNTER — Encounter: Payer: Self-pay | Admitting: Obstetrics and Gynecology

## 2021-07-17 VITALS — BP 130/84 | HR 79 | Resp 16 | Ht <= 58 in | Wt 180.7 lb

## 2021-07-17 DIAGNOSIS — N939 Abnormal uterine and vaginal bleeding, unspecified: Secondary | ICD-10-CM

## 2021-07-17 DIAGNOSIS — E282 Polycystic ovarian syndrome: Secondary | ICD-10-CM

## 2021-07-17 DIAGNOSIS — N970 Female infertility associated with anovulation: Secondary | ICD-10-CM

## 2021-07-17 MED ORDER — DESOGESTREL-ETHINYL ESTRADIOL 0.15-0.02/0.01 MG (21/5) PO TABS: 1.0000 | ORAL_TABLET | Freq: Every day | ORAL | 1 refills | Status: AC

## 2021-07-17 NOTE — Progress Notes (Signed)
HPI:      Maureen Mcbride is a 30 y.o. G0P0000 who LMP was No LMP recorded. (Menstrual status: Other).  Subjective:   She presents today to discuss her PCO and abnormal uterine bleeding.  She was diagnosed with PCO by ultrasound approximately 7 months ago.  She has been taking metformin but states that "I have been bleeding almost every day."  She desires immediate fertility.  She has never been pregnant. Her partner has 2 living children the youngest is age 84.    Hx: The following portions of the patient's history were reviewed and updated as appropriate:             She  has a past medical history of Hyperprolactinemia (HCC), Mental disorder, and PCOS (polycystic ovarian syndrome) (11/2016). She does not have any pertinent problems on file. She  has a past surgical history that includes Appendectomy (2009) and Urethral dilation (1999). Her family history includes Bipolar disorder in her brother; Colon cancer in her maternal grandfather; Diabetes Mellitus II in her father and paternal grandmother; Hypertension in her mother; Migraines in her maternal grandmother and mother; Polycystic ovary syndrome in her sister; Uterine cancer in her maternal grandmother. She  reports that she has never smoked. She has never used smokeless tobacco. She reports current alcohol use. She reports that she does not use drugs. She has a current medication list which includes the following prescription(s): desogestrel-ethinyl estradiol, letrozole, medroxyprogesterone, and metformin. She is allergic to penicillins.       Review of Systems:  Review of Systems  Constitutional: Denied constitutional symptoms, night sweats, recent illness, fatigue, fever, insomnia and weight loss.  Eyes: Denied eye symptoms, eye pain, photophobia, vision change and visual disturbance.  Ears/Nose/Throat/Neck: Denied ear, nose, throat or neck symptoms, hearing loss, nasal discharge, sinus congestion and sore throat.  Cardiovascular:  Denied cardiovascular symptoms, arrhythmia, chest pain/pressure, edema, exercise intolerance, orthopnea and palpitations.  Respiratory: Denied pulmonary symptoms, asthma, pleuritic pain, productive sputum, cough, dyspnea and wheezing.  Gastrointestinal: Denied, gastro-esophageal reflux, melena, nausea and vomiting.  Genitourinary: See HPI for additional information.  Musculoskeletal: Denied musculoskeletal symptoms, stiffness, swelling, muscle weakness and myalgia.  Dermatologic: Denied dermatology symptoms, rash and scar.  Neurologic: Denied neurology symptoms, dizziness, headache, neck pain and syncope.  Psychiatric: Denied psychiatric symptoms, anxiety and depression.  Endocrine: Denied endocrine symptoms including hot flashes and night sweats.   Meds:   Current Outpatient Medications on File Prior to Visit  Medication Sig Dispense Refill   letrozole (FEMARA) 2.5 MG tablet Take 1 tablet (2.5 mg total) by mouth daily. 5 tablet 2   medroxyPROGESTERone (PROVERA) 10 MG tablet Take 1 tablet (10 mg total) by mouth daily. Use for ten days 10 tablet 2   metFORMIN (GLUCOPHAGE) 500 MG tablet Take one tablet by mouth daily for one week. Then increase to one tablet twice a day for one week.  Then two tablets twice a day. 60 tablet 5   No current facility-administered medications on file prior to visit.      Objective:     Vitals:   07/17/21 1107  BP: 130/84  Pulse: 79  Resp: 16   Filed Weights   07/17/21 1107  Weight: 180 lb 11.2 oz (82 kg)              Previous PCR lab work and ultrasound reviewed          Assessment:    G0P0000 Patient Active Problem List   Diagnosis Date Noted  History of pituitary adenoma 12/26/2020   Family history of cervical cancer 12/26/2020   Family history of uterine cancer 12/26/2020   PCOS (polycystic ovarian syndrome) 05/17/2017     1. PCOS (polycystic ovarian syndrome)   2. Abnormal uterine bleeding   3. Infertility associated with  anovulation     Likely anovulatory with dysfunctional uterine bleeding.  Patient desires immediate fertility   Plan:            1.  We have discussed PCO in some detail.  2.  Patient will begin OCPs for 4 months followed by ovulation predictor kits and timing of intercourse.  3.  Recommend semen analysis -especially prior to any ovulation induction agents.  Rationale discussed.  4.  If patient anovulatory after first round of OCPs consider second round of OCPs followed by Femara.  OCPs The risks /benefits of OCPs have been explained to the patient in detail.  Product literature has been given to her where appropriate.  I have instructed her in the use of OCPs.  I have answered all of her questions, and I believe that she has an understanding of the effectiveness and use of OCPs for PCO.  Orders No orders of the defined types were placed in this encounter.    Meds ordered this encounter  Medications   desogestrel-ethinyl estradiol (MIRCETTE) 0.15-0.02/0.01 MG (21/5) tablet    Sig: Take 1 tablet by mouth at bedtime.    Dispense:  84 tablet    Refill:  1      F/U  Return in about 3 months (around 10/17/2021). I spent 24 minutes involved in the care of this patient preparing to see the patient by obtaining and reviewing her medical history (including labs, imaging tests and prior procedures), documenting clinical information in the electronic health record (EHR), counseling and coordinating care plans, writing and sending prescriptions, ordering tests or procedures and in direct communicating with the patient and medical staff discussing pertinent items from her history and physical exam.  Elonda Husky, M.D. 07/17/2021 11:45 AM

## 2021-08-09 IMAGING — US US PELVIS COMPLETE WITH TRANSVAGINAL
1 series · 13 of 25 positions shown · non-contrast
Comparison: Ultrasound pelvis 10/24/2015

CLINICAL DATA: PCOS, abnormal uterine bleeding. Last menstrual
period: Bleeding for 2 months.

EXAM:
TRANSABDOMINAL AND TRANSVAGINAL ULTRASOUND OF PELVIS
TECHNIQUE: Both transabdominal and transvaginal ultrasound examinations of the
pelvis were performed. Transabdominal technique was performed for
global imaging of the pelvis including uterus, ovaries, adnexal
regions, and pelvic cul-de-sac. It was necessary to proceed with
endovaginal exam following the transabdominal exam to visualize the
endometrium and ovaries.

[Series 1: us pelvis complete with transvaginal · 0.22mm/px · 13 of 105 slices shown]
[im 1/105]
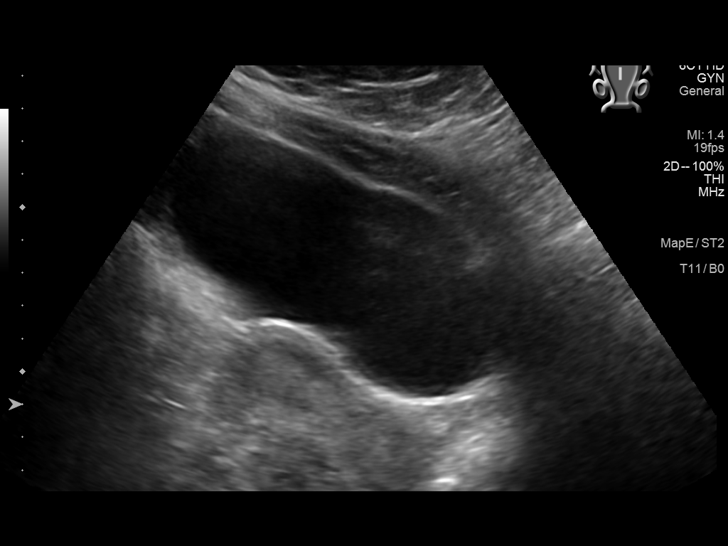
[im 9/105]
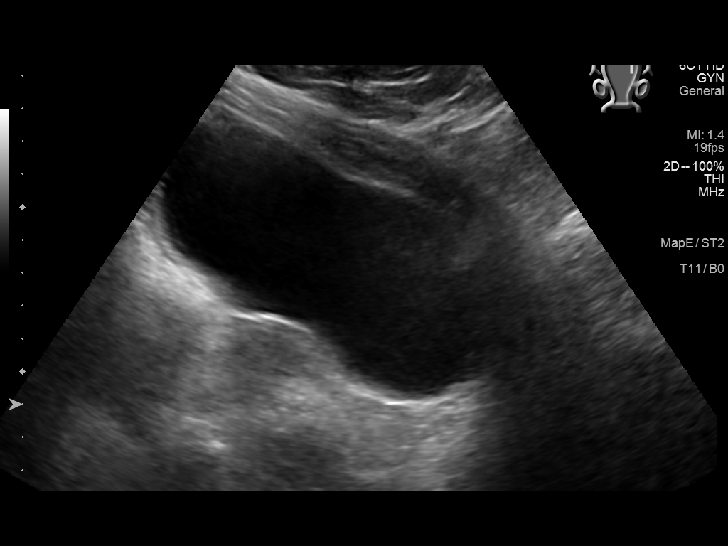
[im 18/105]
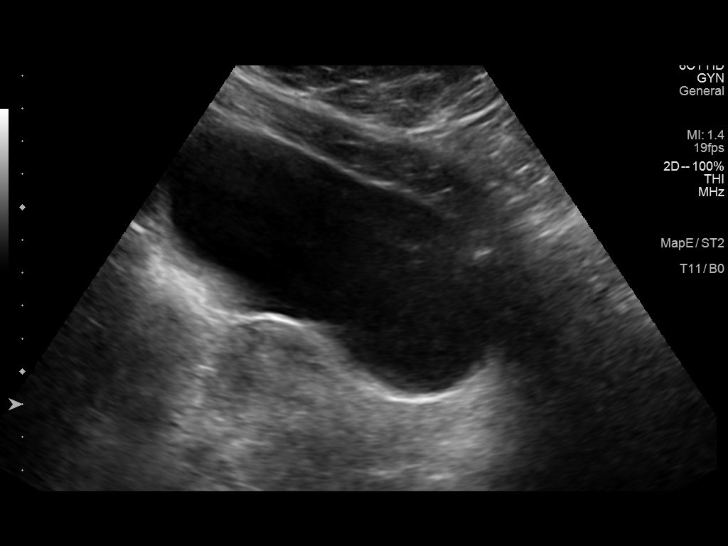
[im 27/105]
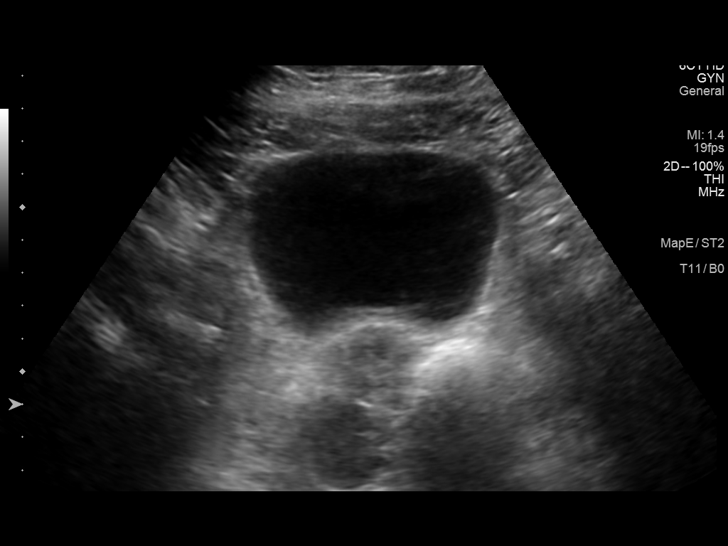
[im 35/105]
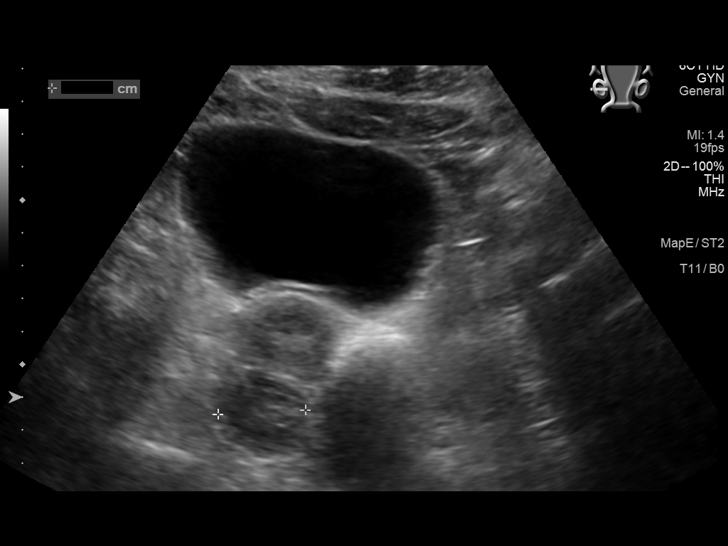
[im 44/105]
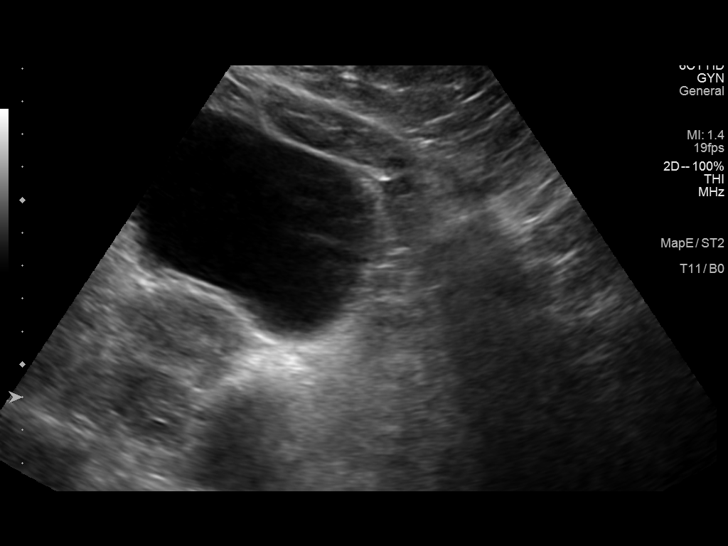
[im 53/105]
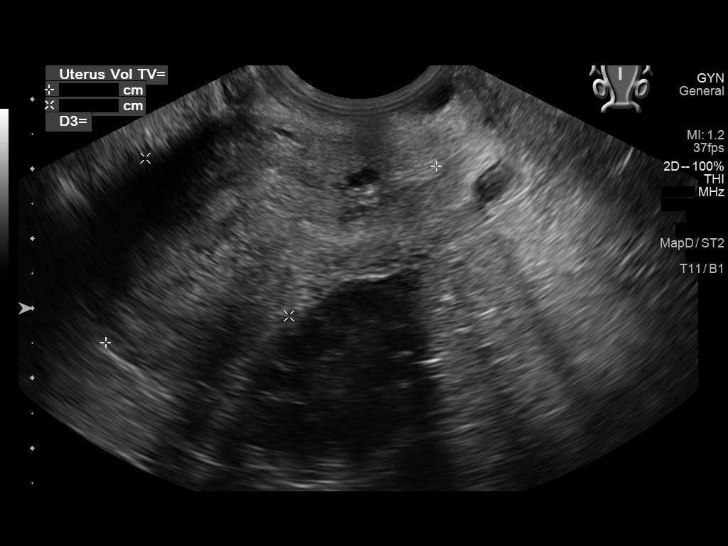
[im 61/105]
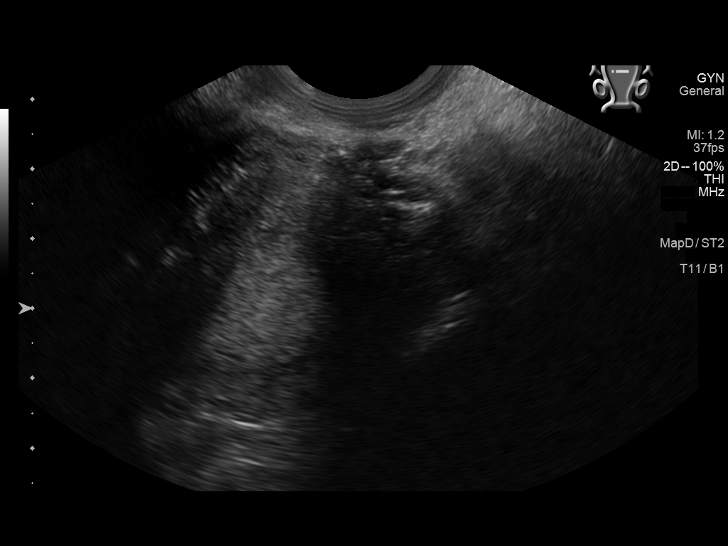
[im 70/105]
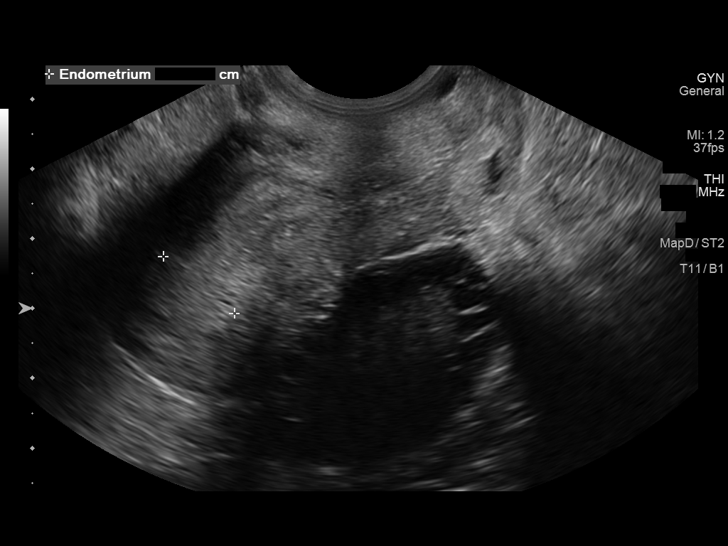
[im 79/105]
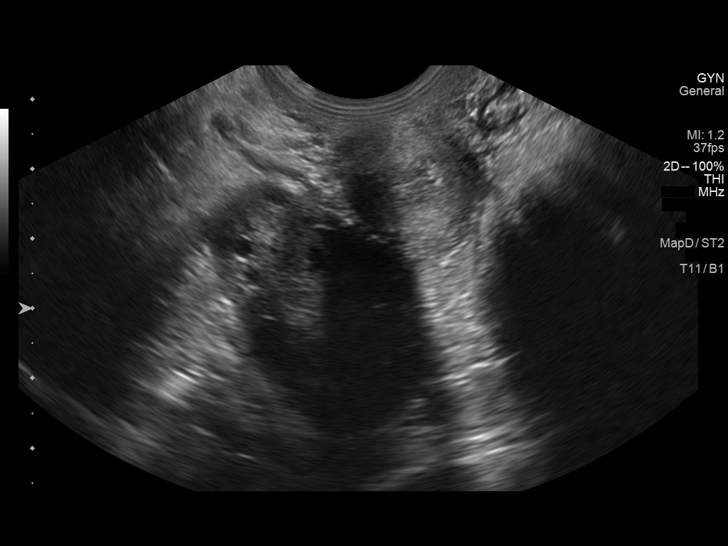
[im 87/105]
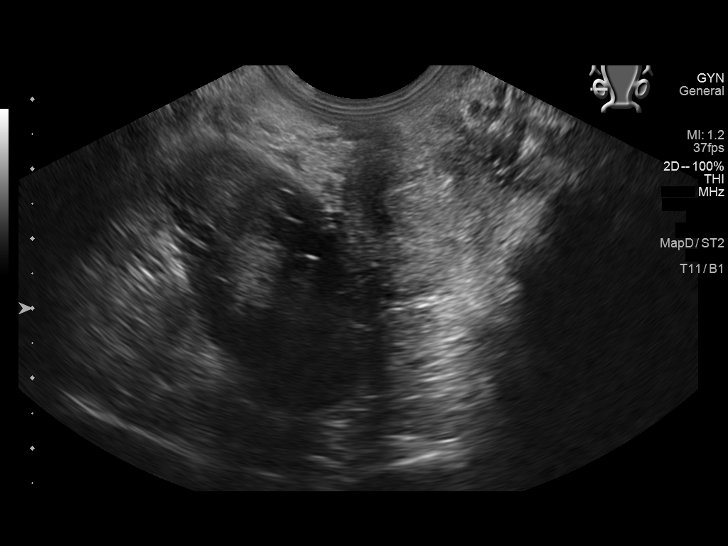
[im 96/105]
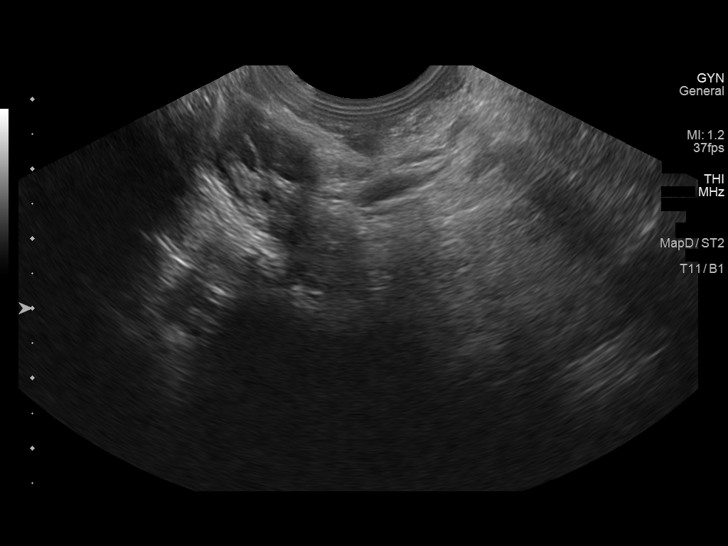
[im 105/105]
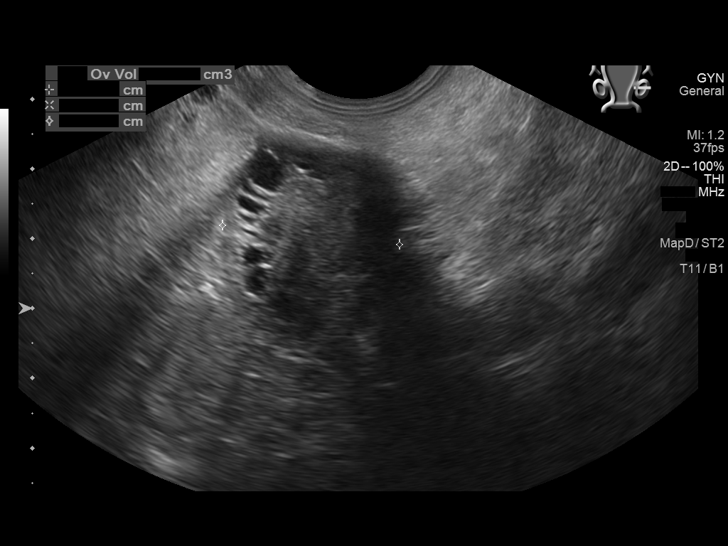

[13 of 25 positions shown; findings below may reference images not displayed]

FINDINGS: Uterus

Measurements: 5.4 x 3.1 x 3.7 cm = volume: 32 mL. No fibroids or
other mass visualized.

Endometrium

Thickness: 13 mm.  No focal abnormality visualized.

Right ovary

Measurements: 4 x 2.8 x 2.3 c = volume: 13.6 mL. Normal
appearance/no adnexal mass. Multiple subcentimeter follicles are
noted; however, imaging is not tailored to evaluate for PCOS.

Left ovary

Measurements: 4.5 x 2.7 x 2.6 cm = volume: 16.2 mL. Normal
appearance/no adnexal mass. Multiple subcentimeter follicles are
noted; however, imaging is not tailored to evaluate for PCOS.

Other findings

No abnormal free fluid.
IMPRESSION: Unremarkable pelvic ultrasound.

## 2021-11-21 ENCOUNTER — Encounter: Payer: Managed Care, Other (non HMO) | Admitting: Obstetrics and Gynecology

## 2022-09-01 ENCOUNTER — Telehealth: Payer: Self-pay | Admitting: Physician Assistant

## 2022-09-01 DIAGNOSIS — B9689 Other specified bacterial agents as the cause of diseases classified elsewhere: Secondary | ICD-10-CM

## 2022-09-01 DIAGNOSIS — J019 Acute sinusitis, unspecified: Secondary | ICD-10-CM

## 2022-09-01 MED ORDER — DOXYCYCLINE HYCLATE 100 MG PO TABS: 100.0000 mg | ORAL_TABLET | Freq: Two times a day (BID) | ORAL | 0 refills | Status: AC

## 2022-09-01 NOTE — Progress Notes (Signed)
Virtual Visit Consent   Maureen Mcbride, you are scheduled for a virtual visit with a [facility] provider today. Just as with appointments in the office, your consent must be obtained to participate. Your consent will be active for this visit and any virtual visit you may have with one of our providers in the next 365 days. If you have a MyChart account, a copy of this consent can be sent to you electronically.  As this is a virtual visit, video technology does not allow for your provider to perform a traditional examination. This may limit your provider's ability to fully assess your condition. If your provider identifies any concerns that need to be evaluated in person or the need to arrange testing (such as labs, EKG, etc.), we will make arrangements to do so. Although advances in technology are sophisticated, we cannot ensure that it will always work on either your end or our end. If the connection with a video visit is poor, the visit may have to be switched to a telephone visit. With either a video or telephone visit, we are not always able to ensure that we have a secure connection.  By engaging in this virtual visit, you consent to the provision of healthcare and authorize for your insurance to be billed (if applicable) for the services provided during this visit. Depending on your insurance coverage, you may receive a charge related to this service.  I need to obtain your verbal consent now. Are you willing to proceed with your visit today? Maureen Mcbride has provided verbal consent on 09/01/2022 for a virtual visit (video or telephone). Mar Daring, PA-C  Date: 09/01/2022 4:34 PM  Virtual Visit via Video Note   I, Mar Daring, connected with  Maureen Mcbride  ([id number], [birth year]) on 09/01/22 at  4:15 PM EDT by a video-enabled telemedicine application and verified that I am speaking with the correct person using two identifiers.  Location: Patient: Virtual Visit Location  Patient: Mobile Provider: Virtual Visit Location Provider: Home Office   I discussed the limitations of evaluation and management by telemedicine and the availability of in person appointments. The patient expressed understanding and agreed to proceed.    History of Present Illness: Maureen Mcbride is a 31 y.o. who identifies as a female who was assigned female at birth, and is being seen today for possible sinus infection.  HPI: Sinusitis This is a new problem. The current episode started in the past 7 days. The problem has been gradually worsening since onset. There has been no fever. The pain is moderate. Associated symptoms include congestion, coughing (dry), headaches and sinus pressure. Pertinent negatives include no chills, ear pain, shortness of breath or sore throat. (Post nasal drainage) Treatments tried: tylenol cold and sinus. The treatment provided no relief.     Problems:  Patient Active Problem List   Diagnosis Date Noted   History of pituitary adenoma 12/26/2020   Family history of cervical cancer 12/26/2020   Family history of uterine cancer 12/26/2020   PCOS (polycystic ovarian syndrome) 05/17/2017    Allergies:  Allergies  Allergen Reactions   Penicillins Hives    Has patient had a PCN reaction causing immediate rash, facial/tongue/throat swelling, SOB or lightheadedness with hypotension: Yes Has patient had a PCN reaction causing severe rash involving mucus membranes or skin necrosis: No Has patient had a PCN reaction that required hospitalization: No Has patient had a PCN reaction occurring within the last 10 years: No If  all of the above answers are "NO", then may proceed with Cephalosporin use.   Medications:  Current Outpatient Medications:    doxycycline (VIBRA-TABS) 100 MG tablet, Take 1 tablet (100 mg total) by mouth 2 (two) times daily., Disp: 20 tablet, Rfl: 0   desogestrel-ethinyl estradiol (MIRCETTE) 0.15-0.02/0.01 MG (21/5) tablet, Take 1 tablet by  mouth at bedtime., Disp: 84 tablet, Rfl: 1   letrozole (FEMARA) 2.5 MG tablet, Take 1 tablet (2.5 mg total) by mouth daily., Disp: 5 tablet, Rfl: 2   medroxyPROGESTERone (PROVERA) 10 MG tablet, Take 1 tablet (10 mg total) by mouth daily. Use for ten days, Disp: 10 tablet, Rfl: 2   metFORMIN (GLUCOPHAGE) 500 MG tablet, Take one tablet by mouth daily for one week. Then increase to one tablet twice a day for one week.  Then two tablets twice a day., Disp: 60 tablet, Rfl: 5  Observations/Objective: Patient is well-developed, well-nourished in no acute distress.  Resting comfortably Head is normocephalic, atraumatic.  No labored breathing.  Speech is clear and coherent with logical content.  Patient is alert and oriented at baseline.    Assessment and Plan: 1. Acute bacterial sinusitis - doxycycline (VIBRA-TABS) 100 MG tablet; Take 1 tablet (100 mg total) by mouth 2 (two) times daily.  Dispense: 20 tablet; Refill: 0  - Worsening symptoms that have not responded to OTC medications.  - Will give Doxycycline - Continue allergy medications.  - Steam and humidifier can help - Stay well hydrated and get plenty of rest.  - Seek in person evaluation if no symptom improvement or if symptoms worsen   Follow Up Instructions: I discussed the assessment and treatment plan with the patient. The patient was provided an opportunity to ask questions and all were answered. The patient agreed with the plan and demonstrated an understanding of the instructions.  A copy of instructions were sent to the patient via MyChart unless otherwise noted below.    The patient was advised to call back or seek an in-person evaluation if the symptoms worsen or if the condition fails to improve as anticipated.  Time:  I spent 8 minutes with the patient via telehealth technology discussing the above problems/concerns.    Margaretann Loveless, PA-C

## 2022-09-01 NOTE — Patient Instructions (Signed)
Maureen Mcbride, thank you for joining Mar Daring, PA-C for today's virtual visit.  While this provider is not your primary care provider (PCP), if your PCP is located in our provider database this encounter information will be shared with them immediately following your visit.  [facility] account gives you access to today's visit and all your visits, tests, and labs performed at [facility] " click here if you don't have a [facility] account or go to [email or web address]  Consent: (Patient) Maureen Mcbride provided verbal consent for this virtual visit at the beginning of the encounter.  Current Medications:  Current Outpatient Medications:    doxycycline (VIBRA-TABS) 100 MG tablet, Take 1 tablet (100 mg total) by mouth 2 (two) times daily., Disp: 20 tablet, Rfl: 0   desogestrel-ethinyl estradiol (MIRCETTE) 0.15-0.02/0.01 MG (21/5) tablet, Take 1 tablet by mouth at bedtime., Disp: 84 tablet, Rfl: 1   letrozole (FEMARA) 2.5 MG tablet, Take 1 tablet (2.5 mg total) by mouth daily., Disp: 5 tablet, Rfl: 2   medroxyPROGESTERone (PROVERA) 10 MG tablet, Take 1 tablet (10 mg total) by mouth daily. Use for ten days, Disp: 10 tablet, Rfl: 2   metFORMIN (GLUCOPHAGE) 500 MG tablet, Take one tablet by mouth daily for one week. Then increase to one tablet twice a day for one week.  Then two tablets twice a day., Disp: 60 tablet, Rfl: 5   Medications ordered in this encounter:  Meds ordered this encounter  Medications   doxycycline (VIBRA-TABS) 100 MG tablet    Sig: Take 1 tablet (100 mg total) by mouth 2 (two) times daily.    Dispense:  20 tablet    Refill:  0    Order Specific Question:   Supervising Provider    Answer:   Chase Picket [id number]     *If you need refills on other medications prior to your next appointment, please contact your pharmacy*  Follow-Up: Call back or seek an in-person evaluation if the symptoms worsen or if the condition  fails to improve as anticipated.  [facility] [phone]  Other Instructions Sinus Infection, Adult A sinus infection, also called sinusitis, is inflammation of your sinuses. Sinuses are hollow spaces in the bones around your face. Your sinuses are located: Around your eyes. In the middle of your forehead. Behind your nose. In your cheekbones. Mucus normally drains out of your sinuses. When your nasal tissues become inflamed or swollen, mucus can become trapped or blocked. This allows bacteria, viruses, and fungi to grow, which leads to infection. Most infections of the sinuses are caused by a virus. A sinus infection can develop quickly. It can last for up to 4 weeks (acute) or for more than 12 weeks (chronic). A sinus infection often develops after a cold. What are the causes? This condition is caused by anything that creates swelling in the sinuses or stops mucus from draining. This includes: Allergies. Asthma. Infection from bacteria or viruses. Deformities or blockages in your nose or sinuses. Abnormal growths in the nose (nasal polyps). Pollutants, such as chemicals or irritants in the air. Infection from fungi. This is rare. What increases the risk? You are more likely to develop this condition if you: Have a weak body defense system (immune system). Do a lot of swimming or diving. Overuse nasal sprays. Smoke. What are the signs or symptoms? The main symptoms of this condition are pain and a feeling of pressure around the affected sinuses. Other symptoms  include: Stuffy nose or congestion that makes it difficult to breathe through your nose. Thick yellow or greenish drainage from your nose. Tenderness, swelling, and warmth over the affected sinuses. A cough that may get worse at night. Decreased sense of smell and taste. Extra mucus that collects in the throat or the back of the nose (postnasal drip) causing a sore throat or bad breath. Tiredness  (fatigue). Fever. How is this diagnosed? This condition is diagnosed based on: Your symptoms. Your medical history. A physical exam. Tests to find out if your condition is acute or chronic. This may include: Checking your nose for nasal polyps. Viewing your sinuses using a device that has a light (endoscope). Testing for allergies or bacteria. Imaging tests, such as an MRI or CT scan. In rare cases, a bone biopsy may be done to rule out more serious types of fungal sinus disease. How is this treated? Treatment for a sinus infection depends on the cause and whether your condition is chronic or acute. If caused by a virus, your symptoms should go away on their own within 10 days. You may be given medicines to relieve symptoms. They include: Medicines that shrink swollen nasal passages (decongestants). A spray that eases inflammation of the nostrils (topical intranasal corticosteroids). Rinses that help get rid of thick mucus in your nose (nasal saline washes). Medicines that treat allergies (antihistamines). Over-the-counter pain relievers. If caused by bacteria, your health care provider may recommend waiting to see if your symptoms improve. Most bacterial infections will get better without antibiotic medicine. You may be given antibiotics if you have: A severe infection. A weak immune system. If caused by narrow nasal passages or nasal polyps, surgery may be needed. Follow these instructions at home: Medicines Take, use, or apply over-the-counter and prescription medicines only as told by your health care provider. These may include nasal sprays. If you were prescribed an antibiotic medicine, take it as told by your health care provider. Do not stop taking the antibiotic even if you start to feel better. Hydrate and humidify  Drink enough fluid to keep your urine pale yellow. Staying hydrated will help to thin your mucus. Use a cool mist humidifier to keep the humidity level in your  home above 50%. Inhale steam for 10-15 minutes, 3-4 times a day, or as told by your health care provider. You can do this in the bathroom while a hot shower is running. Limit your exposure to cool or dry air. Rest Rest as much as possible. Sleep with your head raised (elevated). Make sure you get enough sleep each night. General instructions  Apply a warm, moist washcloth to your face 3-4 times a day or as told by your health care provider. This will help with discomfort. Use nasal saline washes as often as told by your health care provider. Wash your hands often with soap and water to reduce your exposure to germs. If soap and water are not available, use hand sanitizer. Do not smoke. Avoid being around people who are smoking (secondhand smoke). Keep all follow-up visits. This is important. Contact a health care provider if: You have a fever. Your symptoms get worse. Your symptoms do not improve within 10 days. Get help right away if: You have a severe headache. You have persistent vomiting. You have severe pain or swelling around your face or eyes. You have vision problems. You develop confusion. Your neck is stiff. You have trouble breathing. These symptoms may be an emergency. Get help right away.  Call 911. Do not wait to see if the symptoms will go away. Do not drive yourself to the hospital. Summary A sinus infection is soreness and inflammation of your sinuses. Sinuses are hollow spaces in the bones around your face. This condition is caused by nasal tissues that become inflamed or swollen. The swelling traps or blocks the flow of mucus. This allows bacteria, viruses, and fungi to grow, which leads to infection. If you were prescribed an antibiotic medicine, take it as told by your health care provider. Do not stop taking the antibiotic even if you start to feel better. Keep all follow-up visits. This is important. This information is not intended to replace advice given to  you by your health care provider. Make sure you discuss any questions you have with your health care provider. Document Revised: 10/10/2021 Document Reviewed: 10/10/2021 Elsevier Patient Education  [place].    If you have been instructed to have an in-person evaluation today at a local Urgent Care facility, please use the link below. It will take you to a list of all of our available [facility], including address, phone number and hours of operation. Please do not delay care.  [facility]  If you or a family member do not have a primary care provider, use the link below to schedule a visit and establish care. When you choose a [facility] primary care physician or advanced practice provider, you gain a long-term partner in health. Find a Primary Care Provider  Learn more about [facility] in-office and virtual care options: [facility]

## 2023-05-25 ENCOUNTER — Telehealth: Payer: Self-pay | Admitting: Family Medicine

## 2023-05-25 DIAGNOSIS — U071 COVID-19: Secondary | ICD-10-CM

## 2023-05-25 MED ORDER — MOLNUPIRAVIR EUA 200MG CAPSULE: 4.0000 | ORAL_CAPSULE | Freq: Two times a day (BID) | ORAL | 0 refills | Status: AC

## 2023-05-25 NOTE — Progress Notes (Signed)
Virtual Visit Consent   Maureen Mcbride, you are scheduled for a virtual visit with a  provider today. Just as with appointments in the office, your consent must be obtained to participate. Your consent will be active for this visit and any virtual visit you may have with one of our providers in the next 365 days. If you have a MyChart account, a copy of this consent can be sent to you electronically.  As this is a virtual visit, video technology does not allow for your provider to perform a traditional examination. This may limit your provider's ability to fully assess your condition. If your provider identifies any concerns that need to be evaluated in person or the need to arrange testing (such as labs, EKG, etc.), we will make arrangements to do so. Although advances in technology are sophisticated, we cannot ensure that it will always work on either your end or our end. If the connection with a video visit is poor, the visit may have to be switched to a telephone visit. With either a video or telephone visit, we are not always able to ensure that we have a secure connection.  By engaging in this virtual visit, you consent to the provision of healthcare and authorize for your insurance to be billed (if applicable) for the services provided during this visit. Depending on your insurance coverage, you may receive a charge related to this service.  I need to obtain your verbal consent now. Are you willing to proceed with your visit today? Maureen Mcbride has provided verbal consent on 05/25/2023 for a virtual visit (video or telephone). Reed Pandy, [place]  Date: 05/25/2023 10:38 AM  Virtual Visit via Video Note   I, Reed Pandy, connected with  Maureen Mcbride  ([id number], 1991-11-19) on 05/25/23 at 10:30 AM EDT by a video-enabled telemedicine application and verified that I am speaking with the correct person using two identifiers.  Location: Patient: Virtual Visit Location Patient:  Home Provider: Virtual Visit Location Provider: Home Office   I discussed the limitations of evaluation and management by telemedicine and the availability of in person appointments. The patient expressed understanding and agreed to proceed.    History of Present Illness: Maureen Mcbride is a 32 y.o. who identifies as a female who was assigned female at birth, and is being seen today for c/o testing positive for COVID. Pt states she started having symptoms on Thursday.  Currently having migraines and a stuffy nose.  Pt denies shortness of breath, difficulty breathing, or fever.    HPI: HPI  Problems:  Patient Active Problem List   Diagnosis Date Noted   History of pituitary adenoma 12/26/2020   Family history of cervical cancer 12/26/2020   Family history of uterine cancer 12/26/2020   PCOS (polycystic ovarian syndrome) 05/17/2017    Allergies:  Allergies  Allergen Reactions   Penicillins Hives    Has patient had a PCN reaction causing immediate rash, facial/tongue/throat swelling, SOB or lightheadedness with hypotension: Yes Has patient had a PCN reaction causing severe rash involving mucus membranes or skin necrosis: No Has patient had a PCN reaction that required hospitalization: No Has patient had a PCN reaction occurring within the last 10 years: No If all of the above answers are "NO", then may proceed with Cephalosporin use.   Medications:  Current Outpatient Medications:    desogestrel-ethinyl estradiol (MIRCETTE) 0.15-0.02/0.01 MG (21/5) tablet, Take 1 tablet by mouth at bedtime., Disp: 84 tablet, Rfl: 1  doxycycline (VIBRA-TABS) 100 MG tablet, Take 1 tablet (100 mg total) by mouth 2 (two) times daily., Disp: 20 tablet, Rfl: 0   letrozole (FEMARA) 2.5 MG tablet, Take 1 tablet (2.5 mg total) by mouth daily., Disp: 5 tablet, Rfl: 2   medroxyPROGESTERone (PROVERA) 10 MG tablet, Take 1 tablet (10 mg total) by mouth daily. Use for ten days, Disp: 10 tablet, Rfl: 2   metFORMIN  (GLUCOPHAGE) 500 MG tablet, Take one tablet by mouth daily for one week. Then increase to one tablet twice a day for one week.  Then two tablets twice a day., Disp: 60 tablet, Rfl: 5  Observations/Objective: Patient is well-developed, well-nourished in no acute distress.  Resting comfortably at home.  Head is normocephalic, atraumatic.  No labored breathing.  Speech is clear and coherent with logical content.  Patient is alert and oriented at baseline.    Assessment and Plan: 1. COVID  -Start Molnupivir  -Advised Pt if worsening symptoms like shortness of breath or difficulty breathing to head to the emergency room.  -Pt verbalized understanding.   Follow Up Instructions: I discussed the assessment and treatment plan with the patient. The patient was provided an opportunity to ask questions and all were answered. The patient agreed with the plan and demonstrated an understanding of the instructions.  A copy of instructions were sent to the patient via MyChart unless otherwise noted below.     The patient was advised to call back or seek an in-person evaluation if the symptoms worsen or if the condition fails to improve as anticipated.  Time:  I spent 15 minutes with the patient via telehealth technology discussing the above problems/concerns.    Reed Pandy, PA-C

## 2023-05-25 NOTE — Telephone Encounter (Signed)
This was opened by mistake and I do not know how to delete this encounter.

## 2023-05-25 NOTE — Patient Instructions (Signed)
Maureen Mcbride, thank you for joining Reed Pandy, PA-C for today's virtual visit.  While this provider is not your primary care provider (PCP), if your PCP is located in our provider database this encounter information will be shared with them immediately following your visit.   A [facility] MyChart account gives you access to today's visit and all your visits, tests, and labs performed at [facility] " click here if you don't have a [facility] MyChart account or go to [email or web address]  Consent: (Patient) Maureen Mcbride provided verbal consent for this virtual visit at the beginning of the encounter.  Current Medications:  Current Outpatient Medications:    desogestrel-ethinyl estradiol (MIRCETTE) 0.15-0.02/0.01 MG (21/5) tablet, Take 1 tablet by mouth at bedtime., Disp: 84 tablet, Rfl: 1   doxycycline (VIBRA-TABS) 100 MG tablet, Take 1 tablet (100 mg total) by mouth 2 (two) times daily., Disp: 20 tablet, Rfl: 0   letrozole (FEMARA) 2.5 MG tablet, Take 1 tablet (2.5 mg total) by mouth daily., Disp: 5 tablet, Rfl: 2   medroxyPROGESTERone (PROVERA) 10 MG tablet, Take 1 tablet (10 mg total) by mouth daily. Use for ten days, Disp: 10 tablet, Rfl: 2   metFORMIN (GLUCOPHAGE) 500 MG tablet, Take one tablet by mouth daily for one week. Then increase to one tablet twice a day for one week.  Then two tablets twice a day., Disp: 60 tablet, Rfl: 5   Medications ordered in this encounter:  No orders of the defined types were placed in this encounter.    *If you need refills on other medications prior to your next appointment, please contact your pharmacy*  Follow-Up: Call back or seek an in-person evaluation if the symptoms worsen or if the condition fails to improve as anticipated.  [facility] [phone]  Other Instructions COVID-19 COVID-19 is an infection caused by a virus called SARS-CoV-2. This type of virus is called a coronavirus. People with  COVID-19 may: Have little to no symptoms. Have mild to moderate symptoms that affect their lungs and breathing. Get very sick. What are the causes? COVID-19 is caused by a virus. This virus may be in the air as droplets or on surfaces. It can spread from an infected person when they cough, sneeze, speak, sing, or breathe. You may become infected if: You breathe in the infected droplets in the air. You touch an object that has the virus on it. What increases the risk? You are at risk of getting COVID-19 if you have been around someone with the infection. You may be more likely to get very sick if: You are 70 years old or older. You have certain medical conditions, such as: Heart disease. Diabetes. Chronic respiratory disease. Cancer. Pregnancy. You are immunocompromised. This means your body cannot fight infections easily. You have a disability or trouble moving, meaning you're immobile. What are the signs or symptoms? People may have different symptoms from COVID-19. The symptoms can also be mild to severe. They often show up in 5-6 days after being infected. But they can take up to 14 days to appear. Common symptoms are: Cough. Feeling tired. New loss of taste or smell. Fever. Less common symptoms are: Sore throat. Headache. Body or muscle aches. Diarrhea. A skin rash or odd-colored fingers or toes. Red or irritated eyes. Sometimes, COVID-19 does not cause symptoms. How is this diagnosed? COVID-19 can be diagnosed with tests done in the lab or at home. Fluid from your nose, mouth, or lungs will be  used to check for the virus. How is this treated? Treatment for COVID-19 depends on how sick you are. Mild symptoms can be treated at home with rest, fluids, and over-the-counter medicines. Severe symptoms may be treated in a hospital intensive care unit (ICU). If you have symptoms and are at risk of getting very sick, you may be given a medicine that fights viruses. This medicine is  called an antiviral. How is this prevented? To protect yourself from COVID-19: Know your risk factors. Get vaccinated. If your body cannot fight infections easily, talk to your provider about treatment to help prevent COVID-19. Stay at least 1 meter away from others. Wear a well-fitted mask when: You can't stay at a distance from people. You're in a place with poor air flow. Try to be in open spaces with good air flow when in public. Wash your hands often or use an alcohol-based hand sanitizer. Cover your nose and mouth when coughing and sneezing. If you think you have COVID-19 or have been around someone who has it, stay home and be by yourself for 5-10 days. Where to find more information Centers for Disease Control and Prevention (CDC): TonerPromos.no World Health Organization [facility]): VisitDestination.com.br Get help right away if: You have trouble breathing or get short of breath. You have pain or pressure in your chest. You cannot speak or move any part of your body. You are confused. Your symptoms get worse. These symptoms may be an emergency. Get help right away. Call 911. Do not wait to see if the symptoms will go away. Do not drive yourself to the hospital. This information is not intended to replace advice given to you by your health care provider. Make sure you discuss any questions you have with your health care provider. Document Revised: 11/13/2022 Document Reviewed: 07/20/2022 Elsevier Patient Education  2024 [employer].    If you have been instructed to have an in-person evaluation today at a local Urgent Care facility, please use the link below. It will take you to a list of all of our available [facility], including address, phone number and hours of operation. Please do not delay care.  [facility]  If you or a family member do not have a primary care provider, use the link below to schedule a visit and establish care. When you choose a [facility] primary  care physician or advanced practice provider, you gain a long-term partner in health. Find a Primary Care Provider  Learn more about [facility]'s in-office and virtual care options: [facility] - Get Care Now
